# Patient Record
Sex: Female | Born: 1935 | Race: White | Hispanic: No | Marital: Married | Smoking: Never smoker
Health system: Southern US, Community
[De-identification: ages and names within clinical notes are randomized; demographics above are authoritative.]

## PROBLEM LIST (undated history)

## (undated) DIAGNOSIS — J449 Chronic obstructive pulmonary disease, unspecified: Secondary | ICD-10-CM

## (undated) DIAGNOSIS — W19XXXA Unspecified fall, initial encounter: Secondary | ICD-10-CM

## (undated) DIAGNOSIS — R32 Unspecified urinary incontinence: Secondary | ICD-10-CM

## (undated) DIAGNOSIS — G20A1 Parkinson's disease without dyskinesia, without mention of fluctuations: Secondary | ICD-10-CM

## (undated) DIAGNOSIS — K529 Noninfective gastroenteritis and colitis, unspecified: Secondary | ICD-10-CM

## (undated) DIAGNOSIS — R296 Repeated falls: Secondary | ICD-10-CM

## (undated) DIAGNOSIS — K623 Rectal prolapse: Secondary | ICD-10-CM

## (undated) DIAGNOSIS — E039 Hypothyroidism, unspecified: Secondary | ICD-10-CM

## (undated) DIAGNOSIS — R471 Dysarthria and anarthria: Secondary | ICD-10-CM

## (undated) DIAGNOSIS — G20 Parkinson's disease: Secondary | ICD-10-CM

## (undated) DIAGNOSIS — N390 Urinary tract infection, site not specified: Secondary | ICD-10-CM

## (undated) HISTORY — PX: TONSILLECTOMY AND ADENOIDECTOMY: SUR1326

## (undated) HISTORY — DX: Repeated falls: R29.6

## (undated) HISTORY — DX: Rectal prolapse: K62.3

## (undated) HISTORY — DX: Parkinson's disease: G20

## (undated) HISTORY — PX: CATARACT EXTRACTION, BILATERAL: SHX1313

## (undated) HISTORY — DX: Noninfective gastroenteritis and colitis, unspecified: K52.9

## (undated) HISTORY — DX: Chronic obstructive pulmonary disease, unspecified: J44.9

## (undated) HISTORY — DX: Unspecified urinary incontinence: R32

## (undated) HISTORY — DX: Urinary tract infection, site not specified: N39.0

## (undated) HISTORY — DX: Hypothyroidism, unspecified: E03.9

## (undated) HISTORY — PX: FEMUR FRACTURE SURGERY: SHX633

## (undated) HISTORY — PX: REPAIR OF RECTAL PROLAPSE: SHX6420

## (undated) HISTORY — DX: Parkinson's disease without dyskinesia, without mention of fluctuations: G20.A1

## (undated) HISTORY — DX: Unspecified fall, initial encounter: W19.XXXA

## (undated) HISTORY — DX: Dysarthria and anarthria: R47.1

---

## 2013-03-21 HISTORY — PX: CHOLECYSTECTOMY, LAPAROSCOPIC: SHX56

## 2015-12-22 LAB — LIPID PANEL
Cholesterol: 174 mg/dL (ref 0–200)
HDL: 61 mg/dL (ref 35–70)
LDL CALC: 99 mg/dL
TRIGLYCERIDES: 69 mg/dL (ref 40–160)

## 2015-12-22 LAB — VITAMIN D 25 HYDROXY (VIT D DEFICIENCY, FRACTURES): Vit D, 25-Hydroxy: 36

## 2015-12-22 LAB — BASIC METABOLIC PANEL
BUN: 25 mg/dL — AB (ref 4–21)
Creatinine: 0.8 mg/dL (ref 0.5–1.1)
Glucose: 108 mg/dL
Potassium: 4.1 mmol/L (ref 3.4–5.3)
Sodium: 140 mmol/L (ref 137–147)

## 2015-12-22 LAB — HEPATIC FUNCTION PANEL
ALK PHOS: 66 U/L (ref 25–125)
AST: 17 U/L (ref 13–35)
Bilirubin, Total: 0.6 mg/dL

## 2015-12-22 LAB — CBC AND DIFFERENTIAL
HEMATOCRIT: 43 % (ref 36–46)
Hemoglobin: 13.7 g/dL (ref 12.0–16.0)
PLATELETS: 221 10*3/uL (ref 150–399)
WBC: 7.3 10*3/mL

## 2015-12-22 LAB — HEMOGLOBIN A1C: HEMOGLOBIN A1C: 6.2

## 2016-06-23 ENCOUNTER — Encounter: Payer: Self-pay | Admitting: Internal Medicine

## 2016-07-12 ENCOUNTER — Non-Acute Institutional Stay: Payer: Medicare Other | Admitting: Internal Medicine

## 2016-07-12 ENCOUNTER — Encounter: Payer: Self-pay | Admitting: Internal Medicine

## 2016-07-12 VITALS — BP 114/72 | HR 77 | Temp 97.7°F | Ht 64.0 in | Wt 89.0 lb

## 2016-07-12 DIAGNOSIS — E039 Hypothyroidism, unspecified: Secondary | ICD-10-CM

## 2016-07-12 DIAGNOSIS — R471 Dysarthria and anarthria: Secondary | ICD-10-CM

## 2016-07-12 DIAGNOSIS — N812 Incomplete uterovaginal prolapse: Secondary | ICD-10-CM

## 2016-07-12 DIAGNOSIS — J449 Chronic obstructive pulmonary disease, unspecified: Secondary | ICD-10-CM

## 2016-07-12 DIAGNOSIS — G20 Parkinson's disease: Secondary | ICD-10-CM

## 2016-07-12 DIAGNOSIS — W19XXXA Unspecified fall, initial encounter: Secondary | ICD-10-CM | POA: Insufficient documentation

## 2016-07-12 DIAGNOSIS — R2689 Other abnormalities of gait and mobility: Secondary | ICD-10-CM

## 2016-07-12 DIAGNOSIS — G20A1 Parkinson's disease without dyskinesia, without mention of fluctuations: Secondary | ICD-10-CM

## 2016-07-12 DIAGNOSIS — R32 Unspecified urinary incontinence: Secondary | ICD-10-CM | POA: Diagnosis not present

## 2016-07-12 DIAGNOSIS — K8020 Calculus of gallbladder without cholecystitis without obstruction: Secondary | ICD-10-CM | POA: Insufficient documentation

## 2016-07-12 DIAGNOSIS — R296 Repeated falls: Secondary | ICD-10-CM | POA: Insufficient documentation

## 2016-07-12 NOTE — Progress Notes (Signed)
HISTORY AND PHYSICAL  Location:       Place of Service: Clinic (12)   Extended Emergency Contact Information Primary Emergency Contact: Preyer,Karl Address: [address]          [place], [place] [address] Johnnette Litter of [place] Phone: [phone] Mobile Phone: [phone] Relation: Spouse Secondary Emergency Contact: [place] of [place] Mobile Phone: [phone] Relation: Other  Advanced Directive information Does Patient Have a Medical Advance Directive?: Yes, Type of Advance Directive: [place];Living will (out of state, getting updated to [facility])  Chief Complaint  Patient presents with  . Medical Management of Chronic Issues    New Patient from [place], [place]. Here with husband    HPI:  Moved to [facility] with her husband 05/27/16. Adapting to facility. [place] the help of her husband. She is dependent on him for all ADL.  Parkinson disease ([place]) - present since 1995. Has multiple associated issues: mild dysphagia, dysarthria, retropulsion, poor balance, non walker, memory loss, hallucinations of children.  Hypothyroidism, unspecified type - compensated  Chronic obstructive pulmonary disease, unspecified COPD type ([place]) - prior diagnosis. Denies cough or dyspnea at rest on RA.  Urinary incontinence, unspecified type - cystocele. Previously had pessary, but it was removed prior to her move to [facility]. Hx recurrent UTI.  Cystocele with incomplete uterovaginal prolapse  Balance disorder - hx of falls, but none recently.  Dysarthria - makes communication difficult.    Past Medical History:  Diagnosis Date  . COPD (chronic obstructive pulmonary disease) ([place])   . Falls   . Gastroenteritis   . Hip fracture ([place])   . Hypothyroid   . Parkinson disease ([place])    with dementia and hallucinations  . Rectal prolapse   . Recurrent UTI     Past Surgical History:  Procedure Laterality Date    . CHOLECYSTECTOMY, LAPAROSCOPIC  2015   Dr. Dayton Martes   . ORIF HIP FRACTURE    . REPAIR OF RECTAL PROLAPSE    . TONSILLECTOMY AND ADENOIDECTOMY      No care team member to display  Social History   Social History  . Marital status: Married    Spouse name: N/A  . Number of children: N/A  . Years of education: N/A   Occupational History  . Not on file.   Social History Main Topics  . Smoking status: Never Smoker  . Smokeless tobacco: Never Used  . Alcohol use No  . Drug use: No  . Sexual activity: Not on file   Other Topics Concern  . Not on file   Social History Narrative   Moved to [facility] 05/27/2016   No tobacco use ever   . No alcohol use.    Does drinks/ eats things with caffeine.    Married in [place] lives in a 3 story apartment building with 2 other people and no pets.    Current or past profession; day care worker.    Does not exercise.    Has a living will, DNR and POA/HPOA.     reports that she has never smoked. She has never used smokeless tobacco. She reports that she does not drink alcohol or use drugs.  Family History  Problem Relation Age of Onset  . Heart failure Mother 38  . Lymphoma Father 54  . Parkinson's disease Brother   . Brain cancer Brother 98   Family Status  Relation Status  .  Mother Deceased  . Father Deceased  . Brother Alive  . Daughter Alive  . Son Alive  . Brother Deceased  . Brother Deceased  . Brother Alive  . Daughter Alive  . Son Alive    Immunization History  Administered Date(s) Administered  . Influenza-Unspecified 01/20/2016    No Known Allergies  Medications: Patient's Medications  New Prescriptions   No medications on file  Previous Medications   CARBIDOPA-LEVODOPA (SINEMET IR) 25-100 MG TABLET    Take 1 tablet by mouth. Take one tablet four times daily   ENSURE (ENSURE)    Take 237 mLs by mouth. One daily with lunch   LEVOTHYROXINE (SYNTHROID, LEVOTHROID) 88 MCG TABLET    Take 88 mcg by mouth  daily.   SELEGILINE (ELDEPRYL) 5 MG CAPSULE    Take 5 mg by mouth. Take one capsule daily   TOLTERODINE (DETROL LA) 4 MG 24 HR CAPSULE    Take 4 mg by mouth daily.   TRIMETHOPRIM (TRIMPEX) 100 MG TABLET    Take 100 mg by mouth daily.  Modified Medications   No medications on file  Discontinued Medications   No medications on file    Review of Systems  Constitutional: Positive for fatigue. Negative for activity change, appetite change, chills, diaphoresis, fever and unexpected weight change.       Thin. Regained 6 # since move to [place]: Negative for congestion, ear discharge, ear pain, hearing loss, postnasal drip, rhinorrhea, sore throat, tinnitus, trouble swallowing and voice change.        Dry mouth. Has dentures, but generally does not wear them.  Eyes: Negative for pain, redness, itching and visual disturbance.  Respiratory: Negative for cough, choking, shortness of breath and wheezing.        Hx COPD  Cardiovascular: Negative for chest pain, palpitations and leg swelling.       Low BP  Gastrointestinal: Negative for abdominal distention, abdominal pain, constipation, diarrhea and nausea.  Endocrine: Negative for cold intolerance, heat intolerance, polydipsia, polyphagia and polyuria.       Hypothyroid  Genitourinary: Negative for difficulty urinating, dysuria, flank pain, frequency, hematuria, pelvic pain, urgency and vaginal discharge.       Urine incontinence. Cystocele. Previously used pessary.  Musculoskeletal: Positive for gait problem. Negative for arthralgias, back pain, myalgias, neck pain and neck stiffness.  Skin: Negative for color change, pallor and rash.       Hx oiles  Allergic/Immunologic: Negative.   Neurological: Positive for tremors and weakness. Negative for dizziness, seizures, syncope, numbness and headaches.       Parkinsonism, dysarthria, hallucinations, retropulsion, balance disorder.  Hematological: Negative for adenopathy. Does not bruise/bleed  easily.  Psychiatric/Behavioral: Positive for confusion. Negative for agitation, behavioral problems, dysphoric mood, hallucinations, sleep disturbance and suicidal ideas. The patient is not nervous/anxious and is not hyperactive.        Hallucinates children outside and in room. Previously employed as Environmental education officer for 15 years. Loss of memory.    Vitals:   07/12/16 1006  BP: 114/72  Pulse: 77  Temp: 97.7 F (36.5 C)  TempSrc: Oral  SpO2: 91%  Weight: 89 lb (40.4 kg)  Height: 5' 4" (1.626 m)   Body mass index is 15.28 kg/m. Filed Weights   07/12/16 1006  Weight: 89 lb (40.4 kg)     Physical Exam  Constitutional: She is oriented to person, place, and time. She appears well-developed and well-nourished. No distress.  thin  HENT:  Right Ear:  External ear normal.  Left Ear: External ear normal.  Nose: Nose normal.  Mouth/Throat: Oropharynx is clear and moist. No oropharyngeal exudate.  Eyes: Conjunctivae and EOM are normal. Pupils are equal, round, and reactive to light. No scleral icterus.  Neck: No JVD present. No tracheal deviation present. No thyromegaly present.  Cardiovascular: Normal rate, regular rhythm, normal heart sounds and intact distal pulses.  Exam reveals no gallop and no friction rub.   No murmur heard. Pulmonary/Chest: Effort normal. No respiratory distress. She has no wheezes. She has no rales. She exhibits no tenderness.  Abdominal: She exhibits no distension and no mass. There is no tenderness.  Musculoskeletal: Normal range of motion. She exhibits no edema or tenderness.  Poor balance. Rides wheelchair. Husband assists with transfers in and out or bed and wheelchair.  Lymphadenopathy:    She has no cervical adenopathy.  Neurological: She is alert and oriented to person, place, and time. No cranial nerve deficit. Coordination normal.  dysarthria. Tremor bilaterally. Increased rigidity at wrists. Memory deficit. Poor balnce.  Skin: No rash noted. She  is not diaphoretic. No erythema. No pallor.  Psychiatric: She has a normal mood and affect. Her behavior is normal.    Labs reviewed: Lab Summary Latest Ref Rng & Units 12/22/2015  Hemoglobin 12.0 - 16.0 g/dL 13.7  Hematocrit 36 - 46 % 43  White count 10:3/mL 7.3  Platelet count 150 - 399 K/L 221  Sodium 137 - 147 mmol/L 140  Potassium 3.4 - 5.3 mmol/L 4.1  Calcium - (None)  Phosphorus - (None)  Creatinine 0.5 - 1.1 mg/dL 0.8  AST 13 - 35 U/L 17  Alk Phos 25 - 125 U/L 66  Bilirubin - (None)  Glucose mg/dL 108  Cholesterol 0 - 200 mg/dL 174  HDL cholesterol 35 - 70 mg/dL 61  Triglycerides 40 - 160 mg/dL 69  LDL Direct - (None)  LDL Calc mg/dL 99  Total protein - (None)  Albumin - (None)   Lab Results  Component Value Date   BUN 25 (A) 12/22/2015   Lab Results  Component Value Date   HGBA1C 6.2 12/22/2015     Assessment/Plan  1. Parkinson disease ([place]) - The current medical regimen is effective;  continue present plan and medications. - Ambulatory referral to Neurology  2. Hypothyroidism, unspecified type The current medical regimen is effective;  continue present plan and medications.  3. Chronic obstructive pulmonary disease, unspecified COPD type ([place]) No current meds.  4. Urinary incontinence, unspecified type Chronic and unchanged  5. Cystocele with incomplete uterovaginal prolapse Chronic and unchanged  6. Balance disorder Associated with Parkinsonism  7. Dysarthria Associated with Parkinsonism.

## 2016-07-13 ENCOUNTER — Encounter: Payer: Self-pay | Admitting: Neurology

## 2016-07-19 ENCOUNTER — Other Ambulatory Visit: Payer: Self-pay | Admitting: Internal Medicine

## 2016-07-19 ENCOUNTER — Telehealth: Payer: Self-pay | Admitting: Internal Medicine

## 2016-07-19 DIAGNOSIS — N812 Incomplete uterovaginal prolapse: Secondary | ICD-10-CM

## 2016-07-19 DIAGNOSIS — R32 Unspecified urinary incontinence: Secondary | ICD-10-CM

## 2016-07-19 NOTE — Telephone Encounter (Signed)
Done

## 2016-07-19 NOTE — Telephone Encounter (Signed)
I called patients husband this afternoon to give him the appointment information for his referral to Urology - Patient inquired about wife's referral to Urology, I do not have a referral for Urology for patient.  Please advise.

## 2016-07-19 NOTE — Telephone Encounter (Signed)
Patient has been seen once. She has incontinence that was previously treated with a pessary, but this was removed prior to her move to [place]. If her husband wants to have her seen by a urologist, I will enter the order. She is bed and wheelchair bound. I was not aware he wanted a referral for her.

## 2016-07-19 NOTE — Telephone Encounter (Signed)
Called the husband back to verify that he wishes a referral as patient is bed & wheelchair bound. He stated that she is on an antibiotic for UTI's due to her incontinence and he feels this should be evaluated by a urologist as she has been taking the antibiotic for years.   Please enter a Urology referral per husbands request.

## 2016-07-29 DIAGNOSIS — M79671 Pain in right foot: Secondary | ICD-10-CM | POA: Diagnosis not present

## 2016-07-29 DIAGNOSIS — M79672 Pain in left foot: Secondary | ICD-10-CM | POA: Diagnosis not present

## 2016-07-29 DIAGNOSIS — B351 Tinea unguium: Secondary | ICD-10-CM | POA: Diagnosis not present

## 2016-07-29 DIAGNOSIS — L84 Corns and callosities: Secondary | ICD-10-CM | POA: Diagnosis not present

## 2016-08-02 NOTE — Progress Notes (Signed)
Lisa Henry was seen today in the movement disorders clinic for neurologic consultation at the request of [name], MD.  The consultation is for the evaluation of PD.  Dx was apparently made on Oct 1,1995.  First sx was R leg tremor.  Husband states that carbidopa/levodopa and requip were started at same time and she did well with these meds.  Husband unsure of why requip was taken off (presumably memory change but he states it was years ago).  She is on carbidopa/levodopa 25/100, qid (mealtime and bedtime).  She is also on selegeline, 5 mg, 2 po q am.    No prior neuro records are available.     Specific Symptoms:  Tremor: Yes.  , if upset or anxious and at 4am (now both arms and both legs).  Will sometimes awaken her but many times she is awake and then the tremor starts Family hx of similar:  Yes.  , brother Voice: weak and loss of spontaneity of speech Sleep: sleeps well until 3-4 am  Vivid Dreams:  some  Acting out dreams:  Sleep talks some Wet Pillows: No. Postural symptoms:  Yes.  , cannot use a walker because leans backward.  Uses husband for support  Falls?  No. Bradykinesia symptoms: husband has to assist with OOC Loss of smell:  Pt unable to answer Urinary Incontinence:  Yes.  , wears depends x years Difficulty Swallowing:  No. Trouble with ADL's:  Yes.   (relies on husband for assist with all ADLs)  Trouble buttoning clothing: Yes.   Memory changes:  Yes.   (3 years) Hallucinations:  Yes.  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   N/V:  No. Lightheaded: no (was when she was on different bladder drug but better now)  Syncope: No. Diplopia:  No. Dyskinesia:  No.  PREVIOUS MEDICATIONS: Sinemet, Requip, Eldpryl and nuplazid  ALLERGIES:  No Known Allergies  CURRENT MEDICATIONS:  Outpatient Encounter Prescriptions as of 08/04/2016  Medication Sig  . carbidopa-levodopa (SINEMET  IR) 25-100 MG tablet Take 1 tablet by mouth. Take one tablet four times daily  . ENSURE (ENSURE) Take 237 mLs by mouth. One daily with lunch  . levothyroxine (SYNTHROID, LEVOTHROID) 88 MCG tablet Take 88 mcg by mouth daily.  . selegiline (ELDEPRYL) 5 MG capsule Take 5 mg by mouth. Take one capsule daily  . tolterodine (DETROL LA) 4 MG 24 hr capsule Take 4 mg by mouth daily.  Marland Kitchen trimethoprim (TRIMPEX) 100 MG tablet Take 100 mg by mouth daily.   No facility-administered encounter medications on file as of 08/04/2016.     PAST MEDICAL HISTORY:   Past Medical History:  Diagnosis Date  . COPD (chronic obstructive pulmonary disease) ([facility])   . Dysarthria   . Falls   . Gastroenteritis   . Hypothyroid   . Incontinence   . 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 Rectal prolapse   . Recurrent UTI     PAST SURGICAL HISTORY:   Past Surgical History:  Procedure Laterality Date  . CATARACT EXTRACTION, BILATERAL    . CHOLECYSTECTOMY, LAPAROSCOPIC  2015   Dr. Arville Care   . FEMUR FRACTURE SURGERY    . REPAIR OF RECTAL PROLAPSE    . TONSILLECTOMY AND ADENOIDECTOMY      SOCIAL HISTORY:   Social History   Social History  . Marital status: Married    Spouse  name: N/A  . Number of children: N/A  . Years of education: N/A   Occupational History  . Not on file.   Social History Main Topics  . Smoking status: Never Smoker  . Smokeless tobacco: Never Used  . Alcohol use No  . Drug use: No  . Sexual activity: Not on file   Other Topics Concern  . Not on file   Social History Narrative   [place] [place] [facility]   No tobacco use ever   . No alcohol use.    Does drinks/ eats things with caffeine.    Mar[place] in [place] lives in a 3 story apartment building with 2 other people and no pets.    Current or past profession; day care worker.    Does not exercise.    Has a living will, DNR and POA/HPOA.     FAMILY HISTORY:   Family Status  Relation Status    . Mother Deceased  . Father Deceased  . Brother Alive  . Daughter Alive  . Son Alive  . Brother Deceased  . Brother Deceased  . Brother Alive  . Daughter Alive  . Son Alive    ROS:  A complete 10 system review of systems was obtained and was unremarkable apart from what is mentioned above.  PHYSICAL EXAMINATION:    VITALS:   Vitals:   08/04/16 0934  BP: 140/70  Pulse: 84    GEN:  The patient appears stated age and is in NAD. HEENT:  Normocephalic, atraumatic.  The mucous membranes are very dry. The superficial temporal arteries are without ropiness or tenderness. CV:  RRR Lungs:  CTAB Neck/HEME:  There are no carotid bruits bilaterally.  Neurological examination:  Orientation: The patient is alert but unable to name month, date, year.  Attempted moca for 20 min but patient just stared at examiner and it had to be discontinued. Cranial nerves: There is good facial symmetry. Pupils are equal round and reactive to light bilaterally. Fundoscopic exam is attempted but the disc margins are not well visualized bilaterally. Extraocular muscles are intact. The visual fields are full to confrontational testing. The speech is very hypophonic but lacks spontaneity.  Soft palate rises symmetrically and there is no tongue deviation. Hearing is intact to conversational tone. Sensation: Sensation is intact to light touch.  Does not participate with sensitive aspects of the sensory testing.  Reports vibration is intact at the bilateral big toe.  Motor: Strength is at least antigravity 4.  Does not participate with manual muscle testing. Deep tendon reflexes: Deep tendon reflexes are 2-2+/4 at the bilateral biceps, triceps, brachioradialis, patella and trace at the bilateral achilles. Plantar responses are downgoing bilaterally.  Movement examination: Tone: There is mod increased tone in the LUE and mild to mod in the RUE Abnormal movements: There is rare R foot tremor and L foot tremor that  are independent of one another.  Rare LUE resting tremor  Coordination:  There is decremation with RAM's, with all RAMs but there is also significant apraxia especially with RAMs on the L hemibody Gait and Station: The patient is in a wheelchair and unable to use a walker per her husband.  She only walks with his assistance  ASSESSMENT/PLAN:  1.  Idiopathic Parkinson's disease, advance.  The patient has tremor, bradykinesia, rigidity and mild postural instability.  -We discussed the diagnosis as well as pathophysiology of the disease.  We discussed treatment options as well as prognostic indicators.  Patient  education was provided.  -We discussed that it used to be thought that levodopa would increase risk of melanoma but now it is believed that Parkinsons itself likely increases risk of melanoma. she is to get regular skin checks.  -Greater than 50% of the 60 minute visit was spent in counseling answering questions and talking about what to expect now as well as in the future.  We talked about medication options as well as potential future surgical options.  We talked about safety in the home.  -Continue carbidopa/levodopa 25/100, one tablet 3 times per day, but move 30 minutes prior to the mealtime  -Discontinue nighttime carbidopa/levodopa 25/100 and replace with carbidopa/levodopa 50/200 CR.  I am hoping that this will help her with her 3 AM tremor which is keeping her up  -Told her husband that I will likely discontinue her selegiline in the near future.  This can contribute to cognitive change/hallucinations.  I don't think that this is helping her much either.   2.  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  This is a common part of Parkinson's disease and we discussed this today.  We talked about the nature, etiology and pathophysiology as well as the degenerative nature.    We discussed the importance of  mental and physical exercise in the treatment of dementia, and discussed in detail what these things mean.  We discussed community resources and addressed safety in the home.  We discussed caregiver support and caregiver resources.  We offered our PD social worker as part of a support system.  Pt has been on nuplazid in the past and they thought that it worked for a while, but ended up stopping the medication in November, 2017.  They did not notice a deterioration in symptoms when she stopped it, but also noticed that nothing got better.  We may try quetiapine in the future.  She is having significant agitation in the evening.  3.  I will plan on seeing them back in about 3 months, sooner should new neurologic issues arise.    Cc:  Kimber Relic, MD

## 2016-08-04 ENCOUNTER — Encounter: Payer: Self-pay | Admitting: Neurology

## 2016-08-04 ENCOUNTER — Telehealth: Payer: Self-pay | Admitting: Neurology

## 2016-08-04 ENCOUNTER — Ambulatory Visit (INDEPENDENT_AMBULATORY_CARE_PROVIDER_SITE_OTHER): Payer: Medicare Other | Admitting: Neurology

## 2016-08-04 VITALS — BP 140/70 | HR 84

## 2016-08-04 DIAGNOSIS — F0281 Dementia in other diseases classified elsewhere with behavioral disturbance: Secondary | ICD-10-CM

## 2016-08-04 DIAGNOSIS — G20 Parkinson's disease: Secondary | ICD-10-CM | POA: Diagnosis not present

## 2016-08-04 DIAGNOSIS — R441 Visual hallucinations: Secondary | ICD-10-CM

## 2016-08-04 MED ORDER — CARBIDOPA-LEVODOPA ER 50-200 MG PO TBCR: 1.0000 | EXTENDED_RELEASE_TABLET | Freq: Every day | ORAL | 1 refills | Status: DC

## 2016-08-04 NOTE — Patient Instructions (Signed)
1.  Take carbidopa/levodopa 25/100 three times per day, 30 min prior to a protein source 2.  Discontinue night time carbidopa/levodopa 25/100 3.  Add carbidopa/levodopa 50/200 at nighttime (bedtime) 4.  For now, continue selegeline but I likely will stop that in the future 5.  Good to meet you!  Make your follow up in about 3 months

## 2016-08-04 NOTE — Telephone Encounter (Signed)
-  [facility] [place] - Dr. Lindalou Hoseotugno ph- [phone] fax- [phone].

## 2016-08-05 ENCOUNTER — Other Ambulatory Visit: Payer: Self-pay | Admitting: *Deleted

## 2016-08-05 MED ORDER — LEVOTHYROXINE SODIUM 88 MCG PO TABS: 88.0000 ug | ORAL_TABLET | Freq: Every day | ORAL | 1 refills | Status: DC

## 2016-08-05 NOTE — Telephone Encounter (Signed)
[facility] 

## 2016-08-12 ENCOUNTER — Telehealth: Payer: Self-pay | Admitting: *Deleted

## 2016-08-12 NOTE — Telephone Encounter (Signed)
Karl,husband called and stated that patient's insurance company stated they would only pay for a 30 day supply of patient's thyroid medication due to Brand. He stated that she has been taking the generic Levothyroxine.  Explained to him that we faxed in a Rx for the Generic Levothyroxine. Told him I would call pharmacy and see what was going on.  I called the pharmacy and spoke with Tresa EndoKelly and she corrected the medication in her system and will send out a 90 day supply to patient.

## 2016-09-06 DIAGNOSIS — H524 Presbyopia: Secondary | ICD-10-CM | POA: Diagnosis not present

## 2016-09-06 DIAGNOSIS — H35363 Drusen (degenerative) of macula, bilateral: Secondary | ICD-10-CM | POA: Diagnosis not present

## 2016-09-07 ENCOUNTER — Telehealth: Payer: Self-pay | Admitting: Neurology

## 2016-09-07 MED ORDER — SELEGILINE HCL 5 MG PO CAPS: 5.0000 mg | ORAL_CAPSULE | Freq: Two times a day (BID) | ORAL | 0 refills | Status: DC

## 2016-09-07 NOTE — Telephone Encounter (Signed)
RX sent to pharmacy  

## 2016-09-07 NOTE — Telephone Encounter (Signed)
PT's husband called and said she needs a prescription for Selegiline called in to the mail order [facility] for a 90 day supply

## 2016-09-09 MED ORDER — SELEGILINE HCL 5 MG PO CAPS: 5.0000 mg | ORAL_CAPSULE | Freq: Two times a day (BID) | ORAL | 0 refills | Status: DC

## 2016-09-09 NOTE — Addendum Note (Signed)
Addended bySilvio Pate: MCCRACKEN, JADE L on: 09/09/2016 07:59 AM   Modules accepted: Orders

## 2016-09-16 DIAGNOSIS — N3946 Mixed incontinence: Secondary | ICD-10-CM | POA: Diagnosis not present

## 2016-09-16 DIAGNOSIS — N390 Urinary tract infection, site not specified: Secondary | ICD-10-CM | POA: Diagnosis not present

## 2016-10-06 ENCOUNTER — Telehealth: Payer: Self-pay | Admitting: Neurology

## 2016-10-06 NOTE — Telephone Encounter (Signed)
Caller: Lisa Henry's husband  Urgent? No  Reason for the call: She is needing [facility] [address] Please call husband. Thanks

## 2016-10-06 NOTE — Telephone Encounter (Signed)
RX sent. Patient's husband made aware.

## 2016-10-10 ENCOUNTER — Telehealth: Payer: Self-pay | Admitting: Neurology

## 2016-10-10 MED ORDER — SELEGILINE HCL 5 MG PO CAPS: 5.0000 mg | ORAL_CAPSULE | Freq: Two times a day (BID) | ORAL | 1 refills | Status: DC

## 2016-10-10 NOTE — Telephone Encounter (Signed)
Patient's husband made aware [facility] was [facility]  [facility]

## 2016-10-10 NOTE — Telephone Encounter (Signed)
Patient's husband called to say that [facility] not [facility] the prescription there on Saturday when he went to pick it up. He [facility] is the [facility] on [address] Please Advise. Thanks

## 2016-10-13 ENCOUNTER — Telehealth: Payer: Self-pay | Admitting: Neurology

## 2016-10-13 MED ORDER — SELEGILINE HCL 5 MG PO TABS: 5.0000 mg | ORAL_TABLET | Freq: Two times a day (BID) | ORAL | 1 refills | Status: DC

## 2016-10-13 NOTE — Telephone Encounter (Signed)
Caller: [employer]  Urgent? No  Reason for the call: They called to let you know that the medication Selegiline 5 MG was denied because they would like for her to try an alternative. She said they will be sending out a letter to the office. Thanks

## 2016-10-13 NOTE — Telephone Encounter (Signed)
Received request for prior auth on Selegiline.  Spoke with husband and made him aware I submitted this to insurance.  He states it may be the difference between capsules and tablets - he has had this issue before.  Made him aware if I don't hear from insurance by the end of the day I will send in tablets. He agrees with this plan.

## 2016-10-13 NOTE — Telephone Encounter (Signed)
Caller: Patient's husband  Urgent? Yes  Reason for the call: letter was faxed for Dr. Don Perkingat's signature for Medicare purposes? Patient 's husband said she has 6 pills left. Please see Previous phone note. Thanks

## 2016-10-13 NOTE — Telephone Encounter (Signed)
Selegiline tablets sent instead of capsules.  [facility] states this is covered and will available tomorrow.  Husband made aware.

## 2016-10-14 ENCOUNTER — Telehealth: Payer: Self-pay | Admitting: Neurology

## 2016-10-14 NOTE — Telephone Encounter (Signed)
Caller: [employer]  Urgent? No  Reason for the call: They called to let you know that the medication Selegiline has been denied. They said the tablets were probably covered but not the capsules. Thanks

## 2016-10-14 NOTE — Telephone Encounter (Signed)
Noted. Already sent tablets yesterday after they called with the same message.

## 2016-10-17 ENCOUNTER — Telehealth: Payer: Self-pay | Admitting: Neurology

## 2016-10-17 NOTE — Telephone Encounter (Signed)
PT's spouse called and said the medication they picked up was for $RemoveBefor [id number]$5mg

## 2016-10-17 NOTE — Telephone Encounter (Signed)
Selegiline was written for twice a day. Patient has only ever taken once daily. I advised him to continue once daily - written twice daily as this is the normal RX. Patient's husband expressed understanding. I apologized for the mix up.

## 2016-11-07 ENCOUNTER — Telehealth: Payer: Self-pay | Admitting: Neurology

## 2016-11-07 MED ORDER — CARBIDOPA-LEVODOPA 25-100 MG PO TABS: 1.0000 | ORAL_TABLET | Freq: Three times a day (TID) | ORAL | 0 refills | Status: DC

## 2016-11-07 NOTE — Telephone Encounter (Signed)
RX sent

## 2016-11-07 NOTE — Telephone Encounter (Signed)
Needs new rx [facility] for Carbidopa-Levedopa 25-100 mg for 3 a day.

## 2016-11-16 NOTE — Progress Notes (Signed)
Lisa Henry was seen today in the movement disorders clinic for neurologic consultation at the request of No primary care provider on file..  The consultation is for the evaluation of PD.  Dx was apparently made on Oct 1,1995.  First sx was R leg tremor.  Husband states that carbidopa/levodopa and requip were started at same time and she did well with these meds.  Husband unsure of why requip was taken off (presumably memory change but he states it was years ago).  She is on carbidopa/levodopa 25/100, qid (mealtime and bedtime).  She is also on selegeline, 5 mg, 2 po q am.    No prior neuro records are available.    11/17/16 update:  Patient seen today in follow-up for her Parkinson's disease.  She is accompanied by her husband and daughter who supplements the history.  The patient is on carbidopa/levodopa 25/100, one tablet 3 times per day.  I changed her nighttime levodopa to carbidopa/levodopa 50/200.  I did this in hopes that it would help her 3 AM tremor.  Her husband states that this is still there.  She is still on selegiline 5 mg and takes 2 tablets in the morning.  In regards to hallucinations, she still has these.  She doesn't know that they aren't real.  More fearful of these.   She has illusions as well.  She has had no lightheadedness or near syncope.  No falls since our last visit.  No new medical issues have arisen.  She has some nightmares at night.    PREVIOUS MEDICATIONS: Sinemet, Requip, Eldpryl and nuplazid  ALLERGIES:  No Known Allergies  CURRENT MEDICATIONS:  Outpatient Encounter Prescriptions as of 11/17/2016  Medication Sig  . carbidopa-levodopa (SINEMET CR) 50-200 MG tablet Take 1 tablet by mouth at bedtime.  . carbidopa-levodopa (SINEMET IR) 25-100 MG tablet Take 1 tablet by mouth 3 (three) times daily.  Marland Kitchen. ENSURE (ENSURE) Take 237 mLs by mouth. One daily with lunch  . levothyroxine (SYNTHROID, LEVOTHROID) 88 MCG tablet Take 1 tablet (88 mcg total) by mouth daily.  .  selegiline (ELDEPRYL) 5 MG tablet Take 1 tablet (5 mg total) by mouth 2 (two) times daily with a meal.  . tolterodine (DETROL LA) 4 MG 24 hr capsule Take 4 mg by mouth daily.  Marland Kitchen. trimethoprim (TRIMPEX) 100 MG tablet Take 100 mg by mouth daily.   No facility-administered encounter medications on file as of 11/17/2016.     PAST MEDICAL HISTORY:   Past Medical History:  Diagnosis Date  . COPD (chronic obstructive pulmonary disease) (HCC)   . Dysarthria   . Falls   . Gastroenteritis   . Hypothyroid   . Incontinence   . 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 Rectal prolapse   . Recurrent UTI     PAST SURGICAL HISTORY:   Past Surgical History:  Procedure Laterality Date  . CATARACT EXTRACTION, BILATERAL    . CHOLECYSTECTOMY, LAPAROSCOPIC  2015   Dr. Arville Careepeppi   . FEMUR FRACTURE SURGERY    . REPAIR OF RECTAL PROLAPSE    . TONSILLECTOMY AND ADENOIDECTOMY      SOCIAL HISTORY:   Social History   Social History  . Marital status: Married    Spouse name: N/A  . Number of children: N/A  . Years of education: N/A   Occupational History  . Not on file.   Social History Main Topics  . Smoking status: Never Smoker  . Smokeless  tobacco: Never Used  . Alcohol use No  . Drug use: No  . Sexual activity: Not on file   Other Topics Concern  . Not on file   Social History Narrative   Moved to [facility] 05/27/2016   No tobacco use ever   . No alcohol use.    Does drinks/ eats things with caffeine.    Married in [place] lives in a 3 story apartment building with 2 other people and no pets.    Current or past profession; day care worker.    Does not exercise.    Has a living will, DNR and POA/HPOA.     FAMILY HISTORY:   Family Status  Relation Status  . Mother Deceased  . Father Deceased  . Brother Alive  . Daughter Alive  . Son Alive  . Brother Deceased  . Brother Deceased  . Brother Alive  . Daughter Alive  . Son Alive    ROS:  A complete  10 system review of systems was obtained and was unremarkable apart from what is mentioned above.  PHYSICAL EXAMINATION:    VITALS:   There were no vitals filed for this visit.  GEN:  The patient appears stated age and is in NAD. HEENT:  Normocephalic, atraumatic.  The mucous membranes are very dry. The superficial temporal arteries are without ropiness or tenderness. CV:  RRR Lungs:  CTAB Neck/HEME:  There are no carotid bruits bilaterally.  Neurological examination:  Orientation: The patient is alert but unable to name month, date, year.  Attempted moca for 20 min but patient just stared at examiner and it had to be discontinued. Cranial nerves: There is good facial symmetry. Pupils are equal round and reactive to light bilaterally. Fundoscopic exam is attempted but the disc margins are not well visualized bilaterally. Extraocular muscles are intact. The visual fields are full to confrontational testing. The speech is very hypophonic but lacks spontaneity.  Soft palate rises symmetrically and there is no tongue deviation. Hearing is intact to conversational tone. Sensation: Sensation is intact to light touch.  Does not participate with sensitive aspects of the sensory testing.  Reports vibration is intact at the bilateral big toe.  Motor: Strength is at least antigravity 4.  Does not participate with manual muscle testing.   Movement examination: Tone: There is mod increased tone in the LUE and mild to mod in the RUE.  There is some apraxia Abnormal movements: There is mild L arm tremor Coordination:  There is decremation with RAM's, with all RAMs but there is also significant apraxia especially with RAMs on the L hemibody Gait and Station: The patient is in a wheelchair and unable to use a walker per her husband.  She only walks with his assistance  ASSESSMENT/PLAN:  1.  Idiopathic Parkinson's disease, advance.  The patient has tremor, bradykinesia, rigidity and mild postural  instability.  -We discussed the diagnosis as well as pathophysiology of the disease.  We discussed treatment options as well as prognostic indicators.  Patient education was provided.  -We discussed that it used to be thought that levodopa would increase risk of melanoma but now it is believed that Parkinsons itself likely increases risk of melanoma. she is to get regular skin checks.  -Continue carbidopa/levodopa 25/100, one tablet 3 times per day, but move 30 minutes prior to the mealtime  -continue carbidopa/levodopa 50/200 q hs  -discontinue selegeline.  Likely not helping much and may be contributing to confusion.  Will call  them in a week and see how they are doing   2.  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  This is a common part of Parkinson's disease and we discussed this today.  We talked about the nature, etiology and pathophysiology as well as the degenerative nature.    We discussed the importance of mental and physical exercise in the treatment of dementia, and discussed in detail what these things mean.  We discussed community resources and addressed safety in the home.  We discussed caregiver support and caregiver resources.  We offered our PD social worker as part of a support system.  Pt has been on nuplazid in the past and they thought that it worked for a while, but ended up stopping the medication in November, 2017.  They did not notice a deterioration in symptoms when she stopped it.  Hallucinations are becoming worse.  She is becoming more combattive and fearful.  If no better with stopping selegeline, family would like to start seroquel.  We did talk about the fact that the atypical antipsychotic medications are not indicated for dementia related psychosis and increased risk of mortality in the elderly, usually because of infectious or  cardiac related. Understanding is expressed and they were  agreeable that the benefits outweigh the risks in this case.  3.  Follow up is anticipated in the next few months, sooner should new neurologic issues arise.  Much greater than 50% of this visit was spent in counseling and coordinating care.  Total face to face time:  35 min (daughter never here previously and lots of questions)     Cc:  No primary care provider on file.

## 2016-11-17 ENCOUNTER — Ambulatory Visit (INDEPENDENT_AMBULATORY_CARE_PROVIDER_SITE_OTHER): Payer: Medicare Other | Admitting: Neurology

## 2016-11-17 ENCOUNTER — Encounter: Payer: Self-pay | Admitting: Neurology

## 2016-11-17 VITALS — BP 108/64 | HR 100 | Ht 64.0 in | Wt 86.6 lb

## 2016-11-17 DIAGNOSIS — F0281 Dementia in other diseases classified elsewhere with behavioral disturbance: Secondary | ICD-10-CM

## 2016-11-17 DIAGNOSIS — G20 Parkinson's disease: Secondary | ICD-10-CM

## 2016-11-17 DIAGNOSIS — F02818 Dementia in other diseases classified elsewhere, unspecified severity, with other behavioral disturbance: Secondary | ICD-10-CM

## 2016-11-17 NOTE — Patient Instructions (Signed)
1.  Stop selegeline 2.  We will call you in a week.  If confusion, hallucination and combattiveness are the same, we will add in the medication we discussed 3.  Make a follow up in 3 months

## 2016-11-25 ENCOUNTER — Telehealth: Payer: Self-pay | Admitting: Neurology

## 2016-11-25 MED ORDER — QUETIAPINE FUMARATE 25 MG PO TABS: 25.0000 mg | ORAL_TABLET | Freq: Every day | ORAL | 1 refills | Status: DC

## 2016-11-25 NOTE — Telephone Encounter (Signed)
Patient's husband made aware. RX sent to pharmacy.  

## 2016-11-25 NOTE — Telephone Encounter (Signed)
Calling patient to see how she is doing after stopping Selegiline. To see If confusion, hallucination and combattiveness are the same.  Spoke with husband and he states no difference at all in symptoms. He is agreeable to start medication that was discussed.   Dr. Arbutus Leasat please advise on medication and dosage?

## 2016-11-25 NOTE — Telephone Encounter (Signed)
Start seroquel - 25 mg - 1/2 tablet at night for a week and if not enough, go to 1 tablet at night.  Call him in 2 weeks.  Likely will need daytime dosages for agitation/combatitiness/hallucinations

## 2016-12-12 ENCOUNTER — Telehealth: Payer: Self-pay | Admitting: Neurology

## 2016-12-12 NOTE — Telephone Encounter (Signed)
Called patient's husband to see how she is doing on Seroquel. He states she only started it 4 days ago. She is still on a half tablet. He states the first two doses she woke up more combative and has been more tired. He will keep an eye on this and call after she has been on one tablet at night for two weeks.   He does state her hallucinations and restlessness did get better after selegiline was stopped. He states he didn't notice any difference when I talked to him on 11/25/2016, but that it was better.   We will talk in a few weeks.

## 2016-12-28 DIAGNOSIS — Z23 Encounter for immunization: Secondary | ICD-10-CM | POA: Diagnosis not present

## 2016-12-30 DIAGNOSIS — B351 Tinea unguium: Secondary | ICD-10-CM | POA: Diagnosis not present

## 2016-12-30 DIAGNOSIS — L84 Corns and callosities: Secondary | ICD-10-CM | POA: Diagnosis not present

## 2016-12-30 DIAGNOSIS — M79672 Pain in left foot: Secondary | ICD-10-CM | POA: Diagnosis not present

## 2016-12-30 DIAGNOSIS — M79671 Pain in right foot: Secondary | ICD-10-CM | POA: Diagnosis not present

## 2017-01-01 ENCOUNTER — Other Ambulatory Visit: Payer: Self-pay | Admitting: Neurology

## 2017-01-02 ENCOUNTER — Telehealth: Payer: Self-pay | Admitting: Neurology

## 2017-01-02 MED ORDER — QUETIAPINE FUMARATE 25 MG PO TABS: 25.0000 mg | ORAL_TABLET | Freq: Every day | ORAL | 1 refills | Status: DC

## 2017-01-02 NOTE — Telephone Encounter (Signed)
Left message on machine for patient to call back.

## 2017-01-02 NOTE — Telephone Encounter (Signed)
Patient requested refill of Seroquel. Will call and speak with them prior to filling medication. He was to call in two weeks to let us know how she is doing on medication.

## 2017-01-02 NOTE — Telephone Encounter (Signed)
Spoke with patient's husband. He states she is doing well on Seroquel one tablet at night. She is not combative any longer. Not really helping with hallucinations, but they prefer to stay on one tablet at night. Will sent refill.   Dr. Carmel Sacramento.

## 2017-02-14 ENCOUNTER — Other Ambulatory Visit: Payer: Self-pay | Admitting: Neurology

## 2017-02-14 MED ORDER — CARBIDOPA-LEVODOPA 25-100 MG PO TABS: 1.0000 | ORAL_TABLET | Freq: Three times a day (TID) | ORAL | 0 refills | Status: DC

## 2017-02-14 MED ORDER — CARBIDOPA-LEVODOPA ER 50-200 MG PO TBCR: 1.0000 | EXTENDED_RELEASE_TABLET | Freq: Every day | ORAL | 0 refills | Status: DC

## 2017-02-20 NOTE — Progress Notes (Signed)
Lisa Henry was seen today in the movement disorders clinic for neurologic consultation at the request of [name], Mahima, MD.  The consultation is for the evaluation of PD.  Dx was apparently made on Oct 1,1995.  First sx was R leg tremor.  Husband states that carbidopa/levodopa and requip were started at same time and she did well with these meds.  Husband unsure of why requip was taken off (presumably memory change but he states it was years ago).  She is on carbidopa/levodopa 25/100, qid (mealtime and bedtime).  She is also on selegeline, 5 mg, 2 po q am.    No prior neuro records are available.    11/17/16 update:  Patient seen today in follow-up for her Parkinson's disease.  She is accompanied by her husband and daughter who supplements the history.  The patient is on carbidopa/levodopa 25/100, one tablet 3 times per day.  I changed her nighttime levodopa to carbidopa/levodopa 50/200.  I did this in hopes that it would help her 3 AM tremor.  Her husband states that this is still there.  She is still on selegiline 5 mg and takes 2 tablets in the morning.  In regards to hallucinations, she still has these.  She doesn't know that they aren't real.  More fearful of these.   She has illusions as well.  She has had no lightheadedness or near syncope.  No falls since our last visit.  No new medical issues have arisen.  She has some nightmares at night.    02/21/17 update: Patient seen today in follow-up for her Parkinson's disease with Parkinson's dementia.  She is accompanied by her husband and daughter who supplement the history.  She is on carbidopa/levodopa 25/100, 1 tablet 3 times per day and carbidopa/levodopa 50/200 at night.  We discontinued her selegiline last visit.  We added quetiapine.  She is on 25 mg at night.  She is less agitated, but still has hallucinations.  Her family did not want to add daytime quetiapine.  Sleeping is better.  Hallucinations are there most of the time but she isn't  agitated by them and haven't been disturbing to patient or family.  Some illusions as well.  Husband has very little respite care.  On Thursdays, he has someone for 2 hours/day 1 week and 3 hours the next day.  His daughter is concerned that that is not enough.  They also are not well equipped within the home, although they do live within senior living.  They do live independently.  PREVIOUS MEDICATIONS: Sinemet, Requip, Eldpryl and nuplazid  ALLERGIES:  No Known Allergies  CURRENT MEDICATIONS:  Outpatient Encounter Medications as of 02/21/2017  Medication Sig  . carbidopa-levodopa (SINEMET CR) 50-200 MG tablet Take 1 tablet by mouth at bedtime.  . carbidopa-levodopa (SINEMET IR) 25-100 MG tablet Take 1 tablet by mouth 3 (three) times daily.  Marland Henry. ENSURE (ENSURE) Take 237 mLs by mouth. One daily with lunch  . levothyroxine (SYNTHROID, LEVOTHROID) 88 MCG tablet Take 1 tablet (88 mcg total) by mouth daily.  . QUEtiapine (SEROQUEL) 25 MG tablet Take 1 tablet (25 mg total) by mouth at bedtime.  . tolterodine (DETROL LA) 4 MG 24 hr capsule Take 4 mg by mouth daily.  Marland Henry. trimethoprim (TRIMPEX) 100 MG tablet Take 100 mg by mouth daily.  . AMBULATORY NON FORMULARY MEDICATION Lift Chair Dx: G20  . [DISCONTINUED] selegiline (ELDEPRYL) 5 MG tablet Take 1 tablet (5 mg total) by mouth 2 (two) times daily  with a meal. (Patient taking differently: Take 5 mg by mouth daily before breakfast. )   No facility-administered encounter medications on file as of 02/21/2017.     PAST MEDICAL HISTORY:   Past Medical History:  Diagnosis Date  . COPD (chronic obstructive pulmonary disease) (HCC)   . Dysarthria   . Falls   . Gastroenteritis   . Hypothyroid   . Incontinence   . 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 Rectal prolapse   . Recurrent UTI     PAST SURGICAL HISTORY:   Past Surgical History:  Procedure Laterality Date  . CATARACT EXTRACTION, BILATERAL    . CHOLECYSTECTOMY,  LAPAROSCOPIC  2015   Dr. Arville Careepeppi   . FEMUR FRACTURE SURGERY    . REPAIR OF RECTAL PROLAPSE    . TONSILLECTOMY AND ADENOIDECTOMY      SOCIAL HISTORY:   Social History   Socioeconomic History  . Marital status: Married    Spouse name: Not on file  . Number of children: Not on file  . Years of education: Not on file  . Highest education level: Not on file  Social Needs  . Financial resource strain: Not on file  . Food insecurity - worry: Not on file  . Food insecurity - inability: Not on file  . Transportation needs - medical: Not on file  . Transportation needs - non-medical: Not on file  Occupational History  . Not on file  Tobacco Use  . Smoking status: Never Smoker  . Smokeless tobacco: Never Used  Substance and Sexual Activity  . Alcohol use: No  . Drug use: No  . Sexual activity: Not on file  Other Topics Concern  . Not on file  Social History Narrative   [place] [facility] [place]   No tobacco use ever   . No alcohol use.    Does drinks/ eats things with caffeine.    Married in [place]1957 lives in a 3 story apartment building with 2 other people and no pets.    Current or past profession; day care worker.    Does not exercise.    Has a living will, DNR and POA/HPOA.     FAMILY HISTORY:   Family Status  Relation Name Status  . Mother Lisa Henry R Henry Henry  . Father Lisa Henry Henry  . Brother Lisa Henry Alive  . Daughter Lisa Henry Alive  . Son Lisa Henry Alive  . Brother Lisa Henry  . Brother Lisa Henry  . Brother Lisa Henry Alive  . Daughter Lisa Henry Alive  . Son Lisa Henry Alive    ROS:  A complete 10 system review of systems was obtained and the patient is really unable to participate with this due to significant dementia.  PHYSICAL EXAMINATION:    VITALS:   Vitals:   02/21/17 1459  BP: 114/66  Pulse: 94  SpO2: 91%    GEN:  The patient appears stated age and is in NAD. HEENT:  Normocephalic, atraumatic.  The mucous membranes are  very dry. The superficial temporal arteries are without ropiness or tenderness. CV:  RRR Lungs:  CTAB Neck/HEME:  There are no carotid bruits bilaterally.  Neurological examination:  Orientation: The patient is alert but is unable to participate with questions of orientation. Cranial nerves: There is good facial symmetry.  She tracks appropriately about the room, but does not participate with extraocular muscle testing.  Her speech is somewhat dysarthric.  Hearing appears to be intact  to conversational tone.   Sensation: Sensation is intact to light touch.  Does not participate with sensitive aspects of the sensory testing.  (Same as prior) Motor: Strength is at least antigravity 4.  Does not participate with manual muscle testing.  (Same as prior)   Movement examination: Tone: There is mild increased tone in both upper extremities.  There is some apraxia Abnormal movements: There is L arm and bilateral LE tremor.   Coordination:  There is difficulty assessing coordination for decremation, primarily due to apraxia.  She is able to do hand opening and closing and finger taps bilaterally. Gait and Station: The patient is in a wheelchair and unable to use a walker per her husband.  She only walks with his assistance and even with assistance, she does not walk well.  She has dystonia of the feet and she trips over her feet.  ASSESSMENT/PLAN:  1.  Idiopathic Parkinson's disease, advance.  The patient has tremor, bradykinesia, rigidity and mild postural instability.  -We discussed the diagnosis as well as pathophysiology of the disease.  We discussed treatment options as well as prognostic indicators.  Patient education was provided.  -Continue carbidopa/levodopa 25/100, one tablet 3 times per day, but move 30 minutes prior to the mealtime  -continue carbidopa/levodopa 50/200 q hs  -discussed respite care for her husband.  I am most concerned about his well-being at this point given the advanced  state of the patient's disease.  I am going to look into some community resources for him, including [facility].  -lift chair RX given  -discussed hospital bed.  They are not interested in that right now.  -home health evaluation/PT home health.  [facility] has a dementia program and I will see if we can get them involved.  -I am going to have them make an appointment with my Parkinson Child psychotherapist.   2.  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  They can give her a half a tablet as needed during the daytime.  The patient likes to take her clothes off during the daytime, which can be very disturbing for the patient's husband.  I told him that he can try to put her button up shirt on backwards and see if that helps.  We also talked about trying to get the patient something like silly putty so that it keeps her hands occupied.  We also talked about using the gait belt around her waist, as it will be difficult for her to get her close off with that on.  In addition, I think the gait belt would be beneficial for her husband when he transfers her.  3.  I will see the patient back in the next 5 months, sooner should new neurologic issues arise.  Greater than 50% of the 40-minute visit was spent in counseling with the patient and her family.     Cc:  Oneal Grout, MD

## 2017-02-21 ENCOUNTER — Ambulatory Visit (INDEPENDENT_AMBULATORY_CARE_PROVIDER_SITE_OTHER): Payer: Medicare Other | Admitting: Neurology

## 2017-02-21 ENCOUNTER — Encounter: Payer: Self-pay | Admitting: Neurology

## 2017-02-21 VITALS — BP 114/66 | HR 94

## 2017-02-21 DIAGNOSIS — F02818 Dementia in other diseases classified elsewhere, unspecified severity, with other behavioral disturbance: Secondary | ICD-10-CM

## 2017-02-21 DIAGNOSIS — G20 Parkinson's disease: Secondary | ICD-10-CM

## 2017-02-21 DIAGNOSIS — F0281 Dementia in other diseases classified elsewhere with behavioral disturbance: Secondary | ICD-10-CM | POA: Diagnosis not present

## 2017-02-21 MED ORDER — AMBULATORY NON FORMULARY MEDICATION: 0 refills | Status: DC

## 2017-02-21 NOTE — Patient Instructions (Signed)
Parkinson's Caregiver Group   Save the Date: First meeting  will be on April 17, 2017  from 2-3:30  Parkinson's disease can be challenging for caregivers too. Changing  abilities and assuming new roles within the family can cause emotional  upheaval. Meeting with a group of peers who are experiencing similar changes is very beneficial for any caregiver. This professionally led support group will provide a safe place for Parkinson's caregivers to connect, share challenges, seek solutions, learn about resources and strengthen coping skills.    When:  Last Monday of the month (unless date falls on a holiday)  2019 Dates: 1/28, 2/25, 3/25, 4/29, 5/20, 6/24, 7/29, 8/26, 9/30, 10/28, 11/25, 12/30   Location: [facility]                                                                                              [address]                                                                                                 [place], [place] [address]                                                                                      Room 204 Time: 2-3:30   Please call Jessica Thomas, MSW, LCSW at [phone] with any questions.    

## 2017-02-22 ENCOUNTER — Telehealth: Payer: Self-pay | Admitting: Neurology

## 2017-02-22 ENCOUNTER — Telehealth: Payer: Self-pay | Admitting: Psychology

## 2017-02-22 NOTE — Telephone Encounter (Signed)
Tc and left a message with daughter Lanora Manislizabeth  To schedule a meeting with both she and her father to get them resources. I suggested meeting over Christmas break as patient's daughter is a full time substitute Runner, broadcasting/film/videoteacher.

## 2017-02-22 NOTE — Telephone Encounter (Signed)
Referral faxed to [facility] for home health memory care clinic. Faxed to [phone] with confirmation received. They will call patient to arrange.

## 2017-02-22 NOTE — Telephone Encounter (Signed)
Tc with patient's daughter. I will meet with she and her father on Thursday, December 27 at 10:30 to go over resources and services that will help the caregiver and patient.

## 2017-02-23 ENCOUNTER — Telehealth: Payer: Self-pay | Admitting: Neurology

## 2017-02-23 NOTE — Telephone Encounter (Signed)
Spoke with Misty StanleyLisa. She forwarded referral to [facility], who she states has a memory care program as well.   [facility] [place] [facility] She states they don't have a memory care specific program, but do work with a lot seniors with memory care issues. They will send in social work, nursing, PT/OT/ST to patient's home. Will call with any issues.

## 2017-02-23 NOTE — Telephone Encounter (Signed)
Lisa Henry from [facility] left a VM message saying they were out of network for pt and needed to refer her else where CB# [phone]

## 2017-02-24 ENCOUNTER — Telehealth: Payer: Self-pay | Admitting: Neurology

## 2017-02-24 NOTE — Telephone Encounter (Signed)
Doug physical therapist with [facility] called in regards to pt and would like a call back [phone]

## 2017-02-24 NOTE — Telephone Encounter (Signed)
Spoke with PT. He states he had a long talk/assessment with the patient and family today. Talked about ways he could help with safety, etc. He states family does not want to proceed with services.

## 2017-03-16 ENCOUNTER — Encounter: Payer: Self-pay | Admitting: Psychology

## 2017-03-16 NOTE — Progress Notes (Signed)
I met with the patient's husband, daughter and son in law today.  They asked if Dr. Carles Collet would order a PT/OT home evaluation since they are in a new environment.  More than anything I think is due to concerned about the caregiver safety with transferring etc. I told them that I would pass this along to Dr. Carles Collet and Luvenia Starch.   I met with the caregiver today to talk about respite options such as adult care centers, private pay respite caregivers.  In addition, I talked with him a little bit about utilizing the services that they have a [facility].  The husband had stated that they had brought into the [facility].  I explained that it may be good for them just to talk with the social worker there to help problem solve the higher level of care needs for the patient and the independent living status and respite care needs for the caregiver.  After leaving my office, they were planning on stopping by [facility] to talk with someone there.

## 2017-03-17 ENCOUNTER — Telehealth: Payer: Self-pay | Admitting: Neurology

## 2017-03-17 NOTE — Telephone Encounter (Signed)
-----  Message from Strum, DO sent at 03/17/2017 12:47 PM EST ----- Regarding: RE: PT/OT eval  ok ----- Message ----- From: Annamaria Helling, CMA Sent: 03/17/2017   8:18 AM To: Ludwig Clarks, DO, Natividad Brood, LCSW Subject: RE: PT/OT eval                                 Patient had evaluation at beginning of December and refused home health services.  Dr. Carles Collet - do you want me to order again?   ----- Message ----- From: Natividad Brood, LCSW Sent: 03/16/2017   1:16 PM To: Annamaria Helling, CMA Subject: PT/OT eval                                     I met with the patient's husband, daughter and son in law today.  They asked if Dr. Carles Collet would order a PT/OT home evaluation since they are in a new environment.  More than anything I think is due to concerned about the caregiver safety with transferring etc. I told them that I would pass this along.  I met with him today to talk about respite options such as adult care centers, private pay respite caregivers.  In addition, I talked with him a little bit about utilizing the services that they have a [facility].  The husband had stated that they had brought into the [facility].  I explained that it may be good for them just to talk with the social worker there to help problem solve the higher level of care needs for the patient and the independent living status and respite care needs for the caregiver.  After leaving my office, they were planning on stopping by [facility] to talk with someone there.

## 2017-03-17 NOTE — Telephone Encounter (Signed)
Spoke with Huntley DecSara at [facility] [phone]) and reordered PT/OT safety evaluations.

## 2017-03-22 ENCOUNTER — Telehealth: Payer: Self-pay | Admitting: Psychology

## 2017-03-22 NOTE — Telephone Encounter (Signed)
I received a [facility] stating that a [facility] therapy order was received and a appointment was scheduled for today to meet with the patient and the caregiver.  Before [facility] went out to the appointment they confirmed with the caregiver that  it was still a good time to come out.  Upon arrival, the patient's husband/caregiver said that there is not really anything they can do at this time and that they are looking into respite care.  He is not interested in physical therapy for the patient at this time.  They offered caregiver training to help with appropriate movements and transfers of the patient and he is not interested at this time.  This is a second physical therapy order in about 6-7 weeks with the same outcome.  I contacted the patient's daughter Lisa Henry to let her know the outcome and why physical therapy will not be able to provide services at this time.

## 2017-04-05 ENCOUNTER — Non-Acute Institutional Stay: Payer: Medicare Other | Admitting: Internal Medicine

## 2017-04-05 ENCOUNTER — Encounter: Payer: Self-pay | Admitting: Internal Medicine

## 2017-04-05 VITALS — BP 118/68 | HR 60 | Temp 97.5°F | Resp 16 | Ht 64.0 in | Wt 93.6 lb

## 2017-04-05 DIAGNOSIS — E43 Unspecified severe protein-calorie malnutrition: Secondary | ICD-10-CM

## 2017-04-05 DIAGNOSIS — G20 Parkinson's disease: Secondary | ICD-10-CM | POA: Diagnosis not present

## 2017-04-05 DIAGNOSIS — R2681 Unsteadiness on feet: Secondary | ICD-10-CM | POA: Diagnosis not present

## 2017-04-05 DIAGNOSIS — E039 Hypothyroidism, unspecified: Secondary | ICD-10-CM

## 2017-04-05 DIAGNOSIS — F0281 Dementia in other diseases classified elsewhere with behavioral disturbance: Secondary | ICD-10-CM | POA: Diagnosis not present

## 2017-04-05 DIAGNOSIS — R32 Unspecified urinary incontinence: Secondary | ICD-10-CM

## 2017-04-05 DIAGNOSIS — N390 Urinary tract infection, site not specified: Secondary | ICD-10-CM

## 2017-04-05 DIAGNOSIS — R471 Dysarthria and anarthria: Secondary | ICD-10-CM | POA: Diagnosis not present

## 2017-04-05 DIAGNOSIS — F02818 Dementia in other diseases classified elsewhere, unspecified severity, with other behavioral disturbance: Secondary | ICD-10-CM

## 2017-04-05 DIAGNOSIS — Z872 Personal history of diseases of the skin and subcutaneous tissue: Secondary | ICD-10-CM

## 2017-04-05 DIAGNOSIS — Z7189 Other specified counseling: Secondary | ICD-10-CM

## 2017-04-05 DIAGNOSIS — G20A1 Parkinson's disease without dyskinesia, without mention of fluctuations: Secondary | ICD-10-CM

## 2017-04-05 NOTE — Progress Notes (Signed)
[facility]  Provider: Blanchie Serve MD   Location:  [place] of Service:  Clinic (12)  PCP: Blanchie Serve, MD Patient Care Team: Blanchie Serve, MD as PCP - General (Internal Medicine)  Extended Emergency Contact Information Primary Emergency Contact: Hester Mates Address: [address]          [place], [place] [address] Johnnette Litter of [place] Phone: [phone] Mobile Phone: [phone] Relation: Spouse Secondary Emergency Contact: [place] of [place] Mobile Phone: [phone] Relation: Other  Goals of Care: Advanced Directive information Advanced Directives 04/05/2017  Does Patient Have a Medical Advance Directive? Yes  Type of Paramedic of [place];Living will;Out of facility DNR (pink MOST or yellow form)  Does patient want to make changes to medical advance directive? Yes (MAU/Ambulatory/Procedural Areas - Information given)  Copy of [place] in Chart? Yes      Chief Complaint  Patient presents with  . Medical Management of Chronic Issues    routine follow up  . Medication Refill    No refills needed at this time    HPI: Patient is a 84 y.o. female seen today for routine visit. She resides in independent living facility with her husband.  Parkinson's disease- takes sinemet cr 50-200 mg daily and 25-100 mg tid. On wheelchair for ambulation and needs to be wheeled around. Husband is the main caretaker. Poor dentition, cant tolerate dentures. Cutting foot in small pieces helps, taking regular textures. No choking, no cough.  Dysarthria- able to express herself some, getting supportive care.  Unsteady gait-  No fall reported. Wheelchair for ambulation, 1 person assistance with transfer.  Dementia with behavior disturbance- with parkinson's advanced. Supportive care and sinemet. Tolerating seroquel well.  Hypothyroidism- takes levothyroxine  88 mcg daily.   UI- Has depends, no rash mentioned. Currently on tolterodine 4 mg daily. Has history of prolapse and was using pessary before. Not at present.   UTI prophylaxis- with history of UI and recurrent UTI, currently on trimethoprim 100 mg daily  Past Medical History:  Diagnosis Date  . COPD (chronic obstructive pulmonary disease) ([place])   . Dysarthria   . Falls   . Gastroenteritis   . Hypothyroid   . Incontinence   . Parkinson disease ([place])    with dementia and hallucinations  . Rectal prolapse   . Recurrent UTI    Past Surgical History:  Procedure Laterality Date  . CATARACT EXTRACTION, BILATERAL    . CHOLECYSTECTOMY, LAPAROSCOPIC  2015   Dr. Dayton Martes   . FEMUR FRACTURE SURGERY    . REPAIR OF RECTAL PROLAPSE    . TONSILLECTOMY AND ADENOIDECTOMY      reports that  has never smoked. she has never used smokeless tobacco. She reports that she does not drink alcohol or use drugs. Social History   Socioeconomic History  . Marital status: Married    Spouse name: Not on file  . Number of children: Not on file  . Years of education: Not on file  . Highest education level: Not on file  Social Needs  . Financial resource strain: Not on file  . Food insecurity - worry: Not on file  . Food insecurity - inability: Not on file  . Transportation needs - medical: Not on file  . Transportation needs - non-medical: Not on file  Occupational History  . Not on file  Tobacco Use  . Smoking status: Never Smoker  .  Smokeless tobacco: Never Used  Substance and Sexual Activity  . Alcohol use: No  . Drug use: No  . Sexual activity: Not on file  Other Topics Concern  . Not on file  Social History Narrative   Moved to [facility] 05/27/2016   No tobacco use ever   . No alcohol use.    Does drinks/ eats things with caffeine.    Married in [place] lives in a 3 story apartment building with 2 other people and no pets.    Current or past profession; day care worker.    Does not  exercise.    Has a living will, DNR and POA/HPOA.     Functional Status Survey:    Family History  Problem Relation Age of Onset  . Heart failure Mother 73  . Lymphoma Father 36  . Parkinson's disease Brother   . Brain cancer Brother 30    Health Maintenance  Topic Date Due  . Samul Dada  01/29/1952  . DEXA SCAN  01/30/2000  . PNA vac Low Risk Adult (1 of 2 - PCV13) 01/30/2000  . INFLUENZA VACCINE  Completed    No Known Allergies  Outpatient Encounter Medications as of 04/05/2017  Medication Sig  . AMBULATORY NON FORMULARY MEDICATION Lift Chair Dx: G20  . carbidopa-levodopa (SINEMET CR) 50-200 MG tablet Take 1 tablet by mouth at bedtime.  . carbidopa-levodopa (SINEMET IR) 25-100 MG tablet Take 1 tablet by mouth 3 (three) times daily.  Marland Kitchen ENSURE (ENSURE) Take 237 mLs by mouth. One daily with lunch  . levothyroxine (SYNTHROID, LEVOTHROID) 88 MCG tablet Take 1 tablet (88 mcg total) by mouth daily.  . QUEtiapine (SEROQUEL) 25 MG tablet Take 1 tablet (25 mg total) by mouth at bedtime.  . tolterodine (DETROL LA) 4 MG 24 hr capsule Take 4 mg by mouth daily.  Marland Kitchen trimethoprim (TRIMPEX) 100 MG tablet Take 100 mg by mouth daily.   No facility-administered encounter medications on file as of 04/05/2017.     Review of Systems  Unable to perform ROS: Dementia (limited with her dysarthria and dementia, husband provides most of HPI and ROS)  Constitutional: Negative for appetite change, chills, fatigue and fever.  HENT: Positive for rhinorrhea and trouble swallowing. Negative for congestion, ear discharge, ear pain and nosebleeds.   Eyes: Positive for visual disturbance.       Has reading glasses  Respiratory: Negative for cough, choking and shortness of breath.   Cardiovascular: Negative for chest pain.  Gastrointestinal: Negative for abdominal pain, blood in stool, constipation, diarrhea, nausea and vomiting.  Genitourinary: Negative for dysuria and hematuria.       Has UI, history  of uterine prolpase  Musculoskeletal: Positive for gait problem. Negative for arthralgias, back pain and joint swelling.  Skin: Positive for wound. Negative for rash.  Neurological: Positive for tremors and speech difficulty. Negative for dizziness and headaches.  Hematological: Bruises/bleeds easily.  Psychiatric/Behavioral: Positive for behavioral problems, confusion and hallucinations. Negative for sleep disturbance.    Vitals:   04/05/17 1122  BP: 118/68  Pulse: 60  Resp: 16  Temp: (!) 97.5 F (36.4 C)  TempSrc: Oral  SpO2: 99%  Weight: 93 lb 9.6 oz (42.5 kg)  Height: '5\' 4"'  (1.626 m)   Body mass index is 16.07 kg/m.   Wt Readings from Last 3 Encounters:  04/05/17 93 lb 9.6 oz (42.5 kg)  11/17/16 86 lb 9 oz (39.3 kg)  07/12/16 89 lb (40.4 kg)   Physical Exam  Constitutional:  No distress.  Thin built, frail  HENT:  Head: Normocephalic and atraumatic.  Mouth/Throat: Oropharynx is clear and moist. No oropharyngeal exudate.  Missing teeth  Eyes: Conjunctivae and EOM are normal. Pupils are equal, round, and reactive to light. Right eye exhibits no discharge. Left eye exhibits no discharge.  Neck: Normal range of motion. Neck supple.  Cardiovascular: Normal rate and regular rhythm.  Pulmonary/Chest: Effort normal and breath sounds normal. No respiratory distress. She has no wheezes. She has no rales. She exhibits no tenderness.  Kyphosis and scoliosis present  Abdominal: Soft. Bowel sounds are normal. There is no tenderness.  Musculoskeletal: She exhibits deformity. She exhibits no edema.  Can move all 4 extremities, unsteady gait, uses wheelchair and husband wheels her around, needs 1 person max assist with transfer  Lymphadenopathy:    She has no cervical adenopathy.  Neurological: She is alert.  Oriented to person, dysarthria present, follows simple commands  Skin: Skin is warm and dry. No rash noted. She is not diaphoretic.  Psychiatric: She has a normal mood and  affect.    Labs reviewed: Basic Metabolic Panel: No results for input(s): NA, K, CL, CO2, GLUCOSE, BUN, CREATININE, CALCIUM, MG, PHOS in the last 8760 hours. Liver Function Tests: No results for input(s): AST, ALT, ALKPHOS, BILITOT, PROT, ALBUMIN in the last 8760 hours. No results for input(s): LIPASE, AMYLASE in the last 8760 hours. No results for input(s): AMMONIA in the last 8760 hours. CBC: No results for input(s): WBC, NEUTROABS, HGB, HCT, MCV, PLT in the last 8760 hours. Cardiac Enzymes: No results for input(s): CKTOTAL, CKMB, CKMBINDEX, TROPONINI in the last 8760 hours. BNP: Invalid input(s): POCBNP Lab Results  Component Value Date   HGBA1C 6.2 12/22/2015   No results found for: TSH No results found for: VITAMINB12 No results found for: FOLATE No results found for: IRON, TIBC, FERRITIN  Lipid Panel: No results for input(s): CHOL, HDL, LDLCALC, TRIG, CHOLHDL, LDLDIRECT in the last 8760 hours. Lab Results  Component Value Date   HGBA1C 6.2 12/22/2015    Procedures since last visit: No results found.  Assessment/Plan  1. Dementia due to Parkinson's disease with behavioral disturbance (HCC) Continue sinemet IR and CR current regimen, supportive care. seroquel for behavioral issues. Filled out day activities form [facility].  - CBC with Differential/Platelets; Future  2. Hypothyroidism, unspecified type Continue levothyroxine for now.  - CMP with eGFR([facility]); Future - TSH; Future - CBC with Differential/Platelets; Future - Lipid Panel; Future  3. Parkinson disease ([place]) Continue sinemet current regimen, supportive care - CBC with Differential/Platelets; Future - Lipid Panel; Future  4. Urinary incontinence, unspecified type Continue detrol and monitor. Perineal care. - CBC with Differential/Platelets; Future  5. Dysarthria With parkinson's disease. Supportive care.   6. Unsteady gait Wheelchair for ambulation, fall risk present  7.  Recurrent UTI Continue trimethoprim  8. Goals of care, counseling/discussion Reviewed goals of care with patient's husband this visit. Went over her living will and [place] paperwork. Went over DNR form and MOST form, filled it out after reviewing goals of care from 12 pm to 12:30 pm. Patient does not want to be resuscitated in absence of pulse or breathing. Husband would like for her to be sent to hospital if needed with focus on limited interventions along with comfort measures. No intubation or mechanical ventilation. No cardioversion. Antibiotic and fluids for defined trial period if needed. Patient and husband does not want feeding tube. Form reviewed and signed by husband/HCPOA.  Copies made to be scanned in patient's chart.   9. History of pressure ulcer Healed area to left buttock. Advised to use wedge cushion to help relief pressure from her bottom while sitting on wheelchair   10. Severe protein-calorie malnutrition ([place]) Has gained few pounds, supportive care and protein supplement    Labs/tests ordered:   Lab Orders     CMP with eGFR([facility])     TSH     CBC with Differential/Platelets     Lipid Panel  Next appointment: 3 months  Communication: reviewed care plan with patient and her husband   Blanchie Serve, MD Internal Medicine [address], [place] [address] Cell Phone (Monday-Friday 8 am - 5 pm): [phone] On Call: [phone] and follow prompts after 5 pm and on weekends Office Phone: [phone] Office Fax: [phone]

## 2017-04-10 ENCOUNTER — Other Ambulatory Visit: Payer: Medicare Other

## 2017-04-13 DIAGNOSIS — E039 Hypothyroidism, unspecified: Secondary | ICD-10-CM | POA: Diagnosis not present

## 2017-04-13 DIAGNOSIS — R32 Unspecified urinary incontinence: Secondary | ICD-10-CM | POA: Diagnosis not present

## 2017-04-13 DIAGNOSIS — F0281 Dementia in other diseases classified elsewhere with behavioral disturbance: Secondary | ICD-10-CM | POA: Diagnosis not present

## 2017-04-13 DIAGNOSIS — G20 Parkinson's disease: Secondary | ICD-10-CM | POA: Diagnosis not present

## 2017-04-13 LAB — COMPLETE METABOLIC PANEL WITH GFR
AG Ratio: 1.3 (calc) (ref 1.0–2.5)
ALBUMIN MSPROF: 3.9 g/dL (ref 3.6–5.1)
ALKALINE PHOSPHATASE (APISO): 50 U/L (ref 33–130)
ALT: 3 U/L — ABNORMAL LOW (ref 6–29)
AST: 13 U/L (ref 10–35)
BUN/Creatinine Ratio: 34 (calc) — ABNORMAL HIGH (ref 6–22)
BUN: 32 mg/dL — ABNORMAL HIGH (ref 7–25)
CALCIUM: 9.3 mg/dL (ref 8.6–10.4)
CO2: 28 mmol/L (ref 20–32)
CREATININE: 0.95 mg/dL — AB (ref 0.60–0.88)
Chloride: 105 mmol/L (ref 98–110)
GFR, EST NON AFRICAN AMERICAN: 56 mL/min/{1.73_m2} — AB (ref >=60)
GFR, Est African American: 65 mL/min/{1.73_m2} (ref >=60)
Globulin: 3 g/dL (calc) (ref 1.9–3.7)
Glucose, Bld: 79 mg/dL (ref 65–99)
Potassium: 4.2 mmol/L (ref 3.5–5.3)
Sodium: 141 mmol/L (ref 135–146)
Total Bilirubin: 0.5 mg/dL (ref 0.2–1.2)
Total Protein: 6.9 g/dL (ref 6.1–8.1)

## 2017-04-13 LAB — CBC WITH DIFFERENTIAL/PLATELET
BASOS PCT: 0.9 %
Basophils Absolute: 62 cells/uL (ref 0–200)
EOS PCT: 2.7 %
Eosinophils Absolute: 186 cells/uL (ref 15–500)
HEMATOCRIT: 39.8 % (ref 35.0–45.0)
Hemoglobin: 12.9 g/dL (ref 11.7–15.5)
LYMPHS ABS: 1863 {cells}/uL (ref 850–3900)
MCH: 28.8 pg (ref 27.0–33.0)
MCHC: 32.4 g/dL (ref 32.0–36.0)
MCV: 88.8 fL (ref 80.0–100.0)
MPV: 11.5 fL (ref 7.5–12.5)
Monocytes Relative: 6.4 %
NEUTROS ABS: 4347 {cells}/uL (ref 1500–7800)
NEUTROS PCT: 63 %
PLATELETS: 245 10*3/uL (ref 140–400)
RBC: 4.48 10*6/uL (ref 3.80–5.10)
RDW: 12.5 % (ref 11.0–15.0)
Total Lymphocyte: 27 %
WBC: 6.9 10*3/uL (ref 3.8–10.8)
WBCMIX: 442 {cells}/uL (ref 200–950)

## 2017-04-13 LAB — LIPID PANEL
Cholesterol: 168 mg/dL (ref <200)
HDL: 46 mg/dL — ABNORMAL LOW (ref >50)
LDL Cholesterol (Calc): 102 mg/dL (calc) — ABNORMAL HIGH
NON-HDL CHOLESTEROL (CALC): 122 mg/dL (ref <130)
TRIGLYCERIDES: 108 mg/dL (ref <150)
Total CHOL/HDL Ratio: 3.7 (calc) (ref <5.0)

## 2017-04-13 LAB — TSH: TSH: 0.16 mIU/L — ABNORMAL LOW (ref 0.40–4.50)

## 2017-04-19 ENCOUNTER — Other Ambulatory Visit: Payer: Self-pay

## 2017-04-19 MED ORDER — LEVOTHYROXINE SODIUM 75 MCG PO TABS: 75.0000 ug | ORAL_TABLET | Freq: Every day | ORAL | 3 refills | Status: DC

## 2017-05-10 ENCOUNTER — Other Ambulatory Visit: Payer: Self-pay | Admitting: Neurology

## 2017-05-10 MED ORDER — CARBIDOPA-LEVODOPA ER 50-200 MG PO TBCR: 1.0000 | EXTENDED_RELEASE_TABLET | Freq: Every day | ORAL | 0 refills | Status: DC

## 2017-05-12 DIAGNOSIS — B351 Tinea unguium: Secondary | ICD-10-CM | POA: Diagnosis not present

## 2017-05-12 DIAGNOSIS — M79672 Pain in left foot: Secondary | ICD-10-CM | POA: Diagnosis not present

## 2017-05-12 DIAGNOSIS — L84 Corns and callosities: Secondary | ICD-10-CM | POA: Diagnosis not present

## 2017-05-12 DIAGNOSIS — M79671 Pain in right foot: Secondary | ICD-10-CM | POA: Diagnosis not present

## 2017-05-23 ENCOUNTER — Other Ambulatory Visit: Payer: Self-pay

## 2017-05-23 DIAGNOSIS — G20 Parkinson's disease: Secondary | ICD-10-CM

## 2017-05-23 DIAGNOSIS — R32 Unspecified urinary incontinence: Secondary | ICD-10-CM

## 2017-05-23 DIAGNOSIS — G20A1 Parkinson's disease without dyskinesia, without mention of fluctuations: Secondary | ICD-10-CM

## 2017-05-23 DIAGNOSIS — E039 Hypothyroidism, unspecified: Secondary | ICD-10-CM

## 2017-05-23 DIAGNOSIS — F0281 Dementia in other diseases classified elsewhere with behavioral disturbance: Secondary | ICD-10-CM

## 2017-05-23 DIAGNOSIS — F02818 Dementia in other diseases classified elsewhere, unspecified severity, with other behavioral disturbance: Secondary | ICD-10-CM

## 2017-05-31 DIAGNOSIS — F0281 Dementia in other diseases classified elsewhere with behavioral disturbance: Secondary | ICD-10-CM | POA: Diagnosis not present

## 2017-05-31 DIAGNOSIS — G20 Parkinson's disease: Secondary | ICD-10-CM | POA: Diagnosis not present

## 2017-05-31 DIAGNOSIS — R32 Unspecified urinary incontinence: Secondary | ICD-10-CM | POA: Diagnosis not present

## 2017-05-31 DIAGNOSIS — E039 Hypothyroidism, unspecified: Secondary | ICD-10-CM | POA: Diagnosis not present

## 2017-06-01 ENCOUNTER — Other Ambulatory Visit: Payer: Self-pay | Admitting: Neurology

## 2017-06-01 ENCOUNTER — Telehealth: Payer: Self-pay | Admitting: Neurology

## 2017-06-01 DIAGNOSIS — F0281 Dementia in other diseases classified elsewhere with behavioral disturbance: Secondary | ICD-10-CM

## 2017-06-01 DIAGNOSIS — E039 Hypothyroidism, unspecified: Secondary | ICD-10-CM

## 2017-06-01 DIAGNOSIS — R32 Unspecified urinary incontinence: Secondary | ICD-10-CM

## 2017-06-01 DIAGNOSIS — G20 Parkinson's disease: Secondary | ICD-10-CM

## 2017-06-01 LAB — CBC WITH DIFFERENTIAL/PLATELET
BASOS PCT: 1.3 %
Basophils Absolute: 82 cells/uL (ref 0–200)
EOS PCT: 3.2 %
Eosinophils Absolute: 202 cells/uL (ref 15–500)
HCT: 38.8 % (ref 35.0–45.0)
Hemoglobin: 13 g/dL (ref 11.7–15.5)
Lymphs Abs: 2186 cells/uL (ref 850–3900)
MCH: 29.6 pg (ref 27.0–33.0)
MCHC: 33.5 g/dL (ref 32.0–36.0)
MCV: 88.4 fL (ref 80.0–100.0)
MPV: 11.6 fL (ref 7.5–12.5)
Monocytes Relative: 7.1 %
NEUTROS PCT: 53.7 %
Neutro Abs: 3383 cells/uL (ref 1500–7800)
PLATELETS: 231 10*3/uL (ref 140–400)
RBC: 4.39 10*6/uL (ref 3.80–5.10)
RDW: 12.7 % (ref 11.0–15.0)
TOTAL LYMPHOCYTE: 34.7 %
WBC: 6.3 10*3/uL (ref 3.8–10.8)
WBCMIX: 447 {cells}/uL (ref 200–950)

## 2017-06-01 LAB — COMPLETE METABOLIC PANEL WITH GFR
AG RATIO: 1.2 (calc) (ref 1.0–2.5)
ALBUMIN MSPROF: 3.7 g/dL (ref 3.6–5.1)
ALT: <3 U/L — ABNORMAL LOW (ref 6–29)
AST: 15 U/L (ref 10–35)
Alkaline phosphatase (APISO): 45 U/L (ref 33–130)
BILIRUBIN TOTAL: 0.6 mg/dL (ref 0.2–1.2)
BUN / CREAT RATIO: 35 (calc) — AB (ref 6–22)
BUN: 33 mg/dL — ABNORMAL HIGH (ref 7–25)
CALCIUM: 9.6 mg/dL (ref 8.6–10.4)
CHLORIDE: 107 mmol/L (ref 98–110)
CO2: 27 mmol/L (ref 20–32)
Creat: 0.94 mg/dL — ABNORMAL HIGH (ref 0.60–0.88)
GFR, EST AFRICAN AMERICAN: 65 mL/min/{1.73_m2} (ref >=60)
GFR, EST NON AFRICAN AMERICAN: 56 mL/min/{1.73_m2} — AB (ref >=60)
GLOBULIN: 3 g/dL (ref 1.9–3.7)
Glucose, Bld: 87 mg/dL (ref 65–99)
POTASSIUM: 4.4 mmol/L (ref 3.5–5.3)
SODIUM: 141 mmol/L (ref 135–146)
TOTAL PROTEIN: 6.7 g/dL (ref 6.1–8.1)

## 2017-06-01 LAB — LIPID PANEL
CHOLESTEROL: 170 mg/dL (ref <200)
HDL: 43 mg/dL — AB (ref >50)
LDL Cholesterol (Calc): 108 mg/dL (calc) — ABNORMAL HIGH
NON-HDL CHOLESTEROL (CALC): 127 mg/dL (ref <130)
TRIGLYCERIDES: 92 mg/dL (ref <150)
Total CHOL/HDL Ratio: 4 (calc) (ref <5.0)

## 2017-06-01 LAB — TSH: TSH: 0.22 m[IU]/L — AB (ref 0.40–4.50)

## 2017-06-01 MED ORDER — CARBIDOPA-LEVODOPA 25-100 MG PO TABS: 1.0000 | ORAL_TABLET | Freq: Three times a day (TID) | ORAL | 0 refills | Status: DC

## 2017-06-01 NOTE — Telephone Encounter (Signed)
Just received request this morning and sent medication to the pharmacy.

## 2017-06-01 NOTE — Telephone Encounter (Signed)
Pt's spouse left a VM message saying the pharmacy faxed over a request for a refill of [place] on 05/17/2017 and the pharmacy has not received a response yet, please advise

## 2017-06-05 ENCOUNTER — Other Ambulatory Visit: Payer: Self-pay

## 2017-06-05 DIAGNOSIS — E039 Hypothyroidism, unspecified: Secondary | ICD-10-CM

## 2017-06-05 MED ORDER — LEVOTHYROXINE SODIUM 50 MCG PO TABS: 50.0000 ug | ORAL_TABLET | Freq: Every day | ORAL | 3 refills | Status: DC

## 2017-07-03 NOTE — Progress Notes (Signed)
Lisa Henry was seen today in the movement disorders clinic for neurologic consultation at the request of Blanchie Serve, MD.  The consultation is for the evaluation of PD.  Dx was apparently made on Oct 1,1995.  First sx was R leg tremor.  Husband states that carbidopa/levodopa and requip were started at same time and she did well with these meds.  Husband unsure of why requip was taken off (presumably memory change but he states it was years ago).  She is on carbidopa/levodopa 25/100, qid (mealtime and bedtime).  She is also on selegeline, 5 mg, 2 po q am.    No prior neuro records are available.    11/17/16 update:  Patient seen today in follow-up for her Parkinson's disease.  She is accompanied by her husband and daughter who supplements the history.  The patient is on carbidopa/levodopa 25/100, one tablet 3 times per day.  I changed her nighttime levodopa to carbidopa/levodopa 50/200.  I did this in hopes that it would help her 3 AM tremor.  Her husband states that this is still there.  She is still on selegiline 5 mg and takes 2 tablets in the morning.  In regards to hallucinations, she still has these.  She doesn't know that they aren't real.  More fearful of these.   She has illusions as well.  She has had no lightheadedness or near syncope.  No falls since our last visit.  No new medical issues have arisen.  She has some nightmares at night.    02/21/17 update: Patient seen today in follow-up for her Parkinson's disease with Parkinson's dementia.  She is accompanied by her husband and daughter who supplement the history.  She is on carbidopa/levodopa 25/100, 1 tablet 3 times per day and carbidopa/levodopa 50/200 at night.  We discontinued her selegiline last visit.  We added quetiapine.  She is on 25 mg at night.  She is less agitated, but still has hallucinations.  Her family did not want to add daytime quetiapine.  Sleeping is better.  Hallucinations are there most of the time but she isn't  agitated by them and haven't been disturbing to patient or family.  Some illusions as well.  Husband has very little respite care.  On Thursdays, he has someone for 2 hours/day 1 week and 3 hours the next day.  His daughter is concerned that that is not enough.  They also are not well equipped within the home, although they do live within senior living.  They do live independently.  07/04/17 update: Patient is seen today in follow-up for Parkinson's disease with associated Parkinson's disease dementia.  She is accompanied by her husband who supplements the history.  Patient remains on carbidopa/levodopa 25/100, 1 tablet 3 times per day as well as carbidopa/levodopa 50/200 at bedtime.  The patient does have hallucinations and associated agitation, so she is on quetiapine 25 mg at night and I told the patient's husband/daughter that they could use half a tablet during the day.  Today, they state that she isn't getting any in the day.  Mood has been "about the same."  Hallucinations are present but better than in the past.  They have met independently with my social worker since last visit.  Records are reviewed from that visit.  Patient was referred for home health services, but refused that and then when they met with my social worker asked for those services again.  When the home health therapist got to the home, services  were refused again.  Husband states that she likes to get on the floor and crawl on the floor.  No falls but generally doesn't walk without her husband.    PREVIOUS MEDICATIONS: Sinemet, Requip, Eldpryl and nuplazid  ALLERGIES:  No Known Allergies  CURRENT MEDICATIONS:  Outpatient Encounter Medications as of 07/04/2017  Medication Sig  . carbidopa-levodopa (SINEMET CR) 50-200 MG tablet Take 1 tablet by mouth at bedtime.  . carbidopa-levodopa (SINEMET IR) 25-100 MG tablet Take 1 tablet by mouth 3 (three) times daily.  Marland Kitchen ENSURE (ENSURE) Take 237 mLs by mouth. One daily with lunch  .  levothyroxine (SYNTHROID, LEVOTHROID) 50 MCG tablet Take 1 tablet (50 mcg total) by mouth daily.  . QUEtiapine (SEROQUEL) 25 MG tablet Take 1 tablet (25 mg total) by mouth at bedtime.  . tolterodine (DETROL LA) 4 MG 24 hr capsule Take 4 mg by mouth daily.  Marland Kitchen trimethoprim (TRIMPEX) 100 MG tablet Take 100 mg by mouth daily.  . [DISCONTINUED] QUEtiapine (SEROQUEL) 25 MG tablet Take 1 tablet (25 mg total) by mouth at bedtime.  . [DISCONTINUED] AMBULATORY NON FORMULARY MEDICATION Lift Chair Dx: G20   No facility-administered encounter medications on file as of 07/04/2017.     PAST MEDICAL HISTORY:   Past Medical History:  Diagnosis Date  . COPD (chronic obstructive pulmonary disease) ([place])   . Dysarthria   . Falls   . Gastroenteritis   . Hypothyroid   . Incontinence   . Parkinson disease ([place])    with dementia and hallucinations  . Rectal prolapse   . Recurrent UTI     PAST SURGICAL HISTORY:   Past Surgical History:  Procedure Laterality Date  . CATARACT EXTRACTION, BILATERAL    . CHOLECYSTECTOMY, LAPAROSCOPIC  2015   Dr. Dayton Martes   . FEMUR FRACTURE SURGERY    . REPAIR OF RECTAL PROLAPSE    . TONSILLECTOMY AND ADENOIDECTOMY      SOCIAL HISTORY:   Social History   Socioeconomic History  . Marital status: Married    Spouse name: Not on file  . Number of children: Not on file  . Years of education: Not on file  . Highest education level: Not on file  Occupational History  . Not on file  Social Needs  . Financial resource strain: Not on file  . Food insecurity:    Worry: Not on file    Inability: Not on file  . Transportation needs:    Medical: Not on file    Non-medical: Not on file  Tobacco Use  . Smoking status: Never Smoker  . Smokeless tobacco: Never Used  Substance and Sexual Activity  . Alcohol use: No  . Drug use: No  . Sexual activity: Not on file  Lifestyle  . Physical activity:    Days per week: Not on file    Minutes per session: Not on file  .  Stress: Not on file  Relationships  . Social connections:    Talks on phone: Not on file    Gets together: Not on file    Attends religious service: Not on file    Active member of club or organization: Not on file    Attends meetings of clubs or organizations: Not on file    Relationship status: Not on file  . Intimate partner violence:    Fear of current or ex partner: Not on file    Emotionally abused: Not on file    Physically abused: Not on file  Forced sexual activity: Not on file  Other Topics Concern  . Not on file  Social History Narrative   Moved to [facility] 05/27/2016   No tobacco use ever   . No alcohol use.    Does drinks/ eats things with caffeine.    Married in [place] lives in a 3 story apartment building with 2 other people and no pets.    Current or past profession; day care worker.    Does not exercise.    Has a living will, DNR and POA/HPOA.     FAMILY HISTORY:   Family Status  Relation Name Status  . Mother Davy Pique Deceased  . Father Lance Bosch Deceased  . Brother [employer]  . Daughter [employer]  . Son [employer]  . Brother Lynann Bologna Deceased  . Brother Roger Deceased  . Brother C. [employer]  . Daughter Manus Gunning  . Son Chrissie Noa Alive    ROS:  A complete 10 system review of systems was obtained and the patient is really unable to participate with this due to significant dementia.  PHYSICAL EXAMINATION:    VITALS:   Vitals:   07/04/17 1116  BP: 104/64  Pulse: 80  SpO2: 91%    GEN:  The patient appears stated age and is in NAD. HEENT:  Normocephalic, atraumatic.  The mucous membranes are very dry. The superficial temporal arteries are without ropiness or tenderness. CV:  RRR Lungs:  CTAB Neck/HEME:  There are no carotid bruits bilaterally.  Neurological examination:  Orientation: The patient is alert but is unable to participate with questions of orientation.  She does tell me that chocolate is her  favorite food.   Cranial nerves: There is good facial symmetry.  She tracks appropriately about the room, but does not participate with extraocular muscle testing.  Her speech is somewhat dysarthric and lacks spontaneity.  Hearing appears to be intact to conversational tone.   Sensation: Sensation is intact to light touch.  Does not participate with sensitive aspects of the sensory testing.  (Same as prior) Motor: Strength is at least antigravity 4.  Does not participate with manual muscle testing.  (Same as prior)   Movement examination: Tone: There is increased tone in the bilateral UE but there is a gegenhalten component Abnormal movements: There is no rest tremor today Coordination:  There is difficulty assessing coordination for decremation, primarily due to apraxia.  She is able to do hand opening and closing and finger taps bilaterally (same as previous). Gait and Station: The patient is in a wheelchair and no longer walks much due to instability/dystonia of the feet.  Labs:  Lab Results  Component Value Date   TSH 0.22 (L) 05/31/2017     Chemistry      Component Value Date/Time   NA 141 05/31/2017 0000   NA 140 12/22/2015   K 4.4 05/31/2017 0000   CL 107 05/31/2017 0000   CO2 27 05/31/2017 0000   BUN 33 (H) 05/31/2017 0000   BUN 25 (A) 12/22/2015   CREATININE 0.94 (H) 05/31/2017 0000   GLU 108 12/22/2015      Component Value Date/Time   CALCIUM 9.6 05/31/2017 0000   ALKPHOS 66 12/22/2015   AST 15 05/31/2017 0000   ALT <3 (L) 05/31/2017 0000   BILITOT 0.6 05/31/2017 0000       ASSESSMENT/PLAN:  1.  Idiopathic Parkinson's disease, advance.  The patient has tremor, bradykinesia, rigidity and mild postural instability.  -  We discussed the diagnosis as well as pathophysiology of the disease.  We discussed treatment options as well as prognostic indicators.  Patient education was provided.  -continue carbidopa/levodopa 25/100 1 po tid  -continue carbidopa/levodopa  50/200 q hs  -caregiving burden growing but husband doing a great job and doesn't want to outsource this   2.  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  Still taking off clothes in day but they didn't try methods discussed last visit to help or to try start small doses of Seroquel if needed for agitation in the daytime.  This is still an option if needed in the future.  3.  Follow-up in 6 months, sooner should new neurologic issues arise.     Cc:  Blanchie Serve, MD

## 2017-07-04 ENCOUNTER — Encounter: Payer: Self-pay | Admitting: Neurology

## 2017-07-04 ENCOUNTER — Ambulatory Visit (INDEPENDENT_AMBULATORY_CARE_PROVIDER_SITE_OTHER): Payer: Medicare Other | Admitting: Neurology

## 2017-07-04 VITALS — BP 104/64 | HR 80

## 2017-07-04 DIAGNOSIS — F02818 Dementia in other diseases classified elsewhere, unspecified severity, with other behavioral disturbance: Secondary | ICD-10-CM

## 2017-07-04 DIAGNOSIS — G20 Parkinson's disease: Secondary | ICD-10-CM

## 2017-07-04 DIAGNOSIS — F0281 Dementia in other diseases classified elsewhere with behavioral disturbance: Secondary | ICD-10-CM

## 2017-07-04 MED ORDER — QUETIAPINE FUMARATE 25 MG PO TABS: 25.0000 mg | ORAL_TABLET | Freq: Every day | ORAL | 2 refills | Status: DC

## 2017-07-04 NOTE — Patient Instructions (Signed)
1.  Call me when your carbidopa/levodopa needs refilled   [facility]  The [facility] team know that living well with a movement disorder extends far beyond our clinic walls. We are together with you. Our team is passionate about providing resources to you and your loved ones who are living with Parkinson's disease and movement disorders. Participate in these programs and join our community. These resources are free or low cost!   [facility] is adding:   Innovative educational programs for patients and caregivers.   Support groups for patients and caregivers living with Parkinson's disease.   Parkinson's specific exercise programs.   Custom tailored therapeutic programs that will benefit patient's living with Parkinson's disease.   We are in this together. You can help and contribute to grow these programs and resources in our community. 100% of the funds donated to the Movement Disorders Fund stays right here in our community to support patients and their caregivers.  To make a tax deductible contribution:  -ask for a [facility]'s envelope in the office today.  - call the Office of Institutional Advancement at [phone].

## 2017-07-12 ENCOUNTER — Non-Acute Institutional Stay: Payer: Medicare Other | Admitting: Internal Medicine

## 2017-07-12 ENCOUNTER — Encounter: Payer: Self-pay | Admitting: Internal Medicine

## 2017-07-12 VITALS — BP 118/70 | HR 80 | Temp 97.4°F | Resp 16 | Ht 64.0 in | Wt 93.6 lb

## 2017-07-12 DIAGNOSIS — R32 Unspecified urinary incontinence: Secondary | ICD-10-CM

## 2017-07-12 DIAGNOSIS — N390 Urinary tract infection, site not specified: Secondary | ICD-10-CM | POA: Diagnosis not present

## 2017-07-12 DIAGNOSIS — M4003 Postural kyphosis, cervicothoracic region: Secondary | ICD-10-CM | POA: Diagnosis not present

## 2017-07-12 DIAGNOSIS — F0281 Dementia in other diseases classified elsewhere with behavioral disturbance: Secondary | ICD-10-CM | POA: Diagnosis not present

## 2017-07-12 DIAGNOSIS — R2681 Unsteadiness on feet: Secondary | ICD-10-CM | POA: Diagnosis not present

## 2017-07-12 DIAGNOSIS — G20 Parkinson's disease: Secondary | ICD-10-CM

## 2017-07-12 DIAGNOSIS — E039 Hypothyroidism, unspecified: Secondary | ICD-10-CM | POA: Diagnosis not present

## 2017-07-12 DIAGNOSIS — E43 Unspecified severe protein-calorie malnutrition: Secondary | ICD-10-CM

## 2017-07-12 DIAGNOSIS — G20A1 Parkinson's disease without dyskinesia, without mention of fluctuations: Secondary | ICD-10-CM

## 2017-07-12 DIAGNOSIS — Z872 Personal history of diseases of the skin and subcutaneous tissue: Secondary | ICD-10-CM

## 2017-07-12 NOTE — Progress Notes (Signed)
[facility]  Provider: Blanchie Serve MD   Location:  [place] of Service:  Clinic (12)  PCP: Blanchie Serve, MD Patient Care Team: Blanchie Serve, MD as PCP - General (Internal Medicine)  Extended Emergency Contact Information Primary Emergency Contact: Hester Mates Address: [address]          [place], [place] [address] Johnnette Litter of [place] Phone: [phone] Mobile Phone: [phone] Relation: Spouse Secondary Emergency Contact: She[place] of [place] Mobile Phone: [phone] Relation: Other  Code Status: DNR  Goals of Care: Advanced Directive information Advanced Directives 04/05/2017  Does Patient Have a Medical Advance Directive? Yes  Type of Paramedic of [place];Living will;Out of facility DNR (pink MOST or yellow form)  Does patient want to make changes to medical advance directive? Yes (MAU/Ambulatory/Procedural Areas - Information given)  Copy of [place] in Chart? Yes      Chief Complaint  Patient presents with  . Medical Management of Chronic Issues    3 month follow up. Patient's husband has some concerns about his wife going back to the urologist  . Medication Refill    No refills needed at this time.     HPI: Patient is a 84 y.o. female seen today for routine visit. She is here with her husband. Limited hpi and ROS from patient with her dementia and trouble with expressions of her thoughts. Her husband provides her HPI and ROS.   Parkinson's disease- takes carbidopa levodopa and is followed by neurology, wheelchair bound, po intake fair. No nausea or vomiting.  OAB- takes detrol 4 mg daily with UI. Also has history of prolapse that worsens her symptom  UTI prophylaxis- seen by urology and on trimethoprim daily, has UI  Protein calorie malnutrition- takes protein supplement, po intake good  Hypothyroidism- takes  levothyroxine, tolerating well  Dementia with behavior issue- on seroquel 25 mg qhs, mood stable, occasional episodes of crying and agitation reported  Past Medical History:  Diagnosis Date  . COPD (chronic obstructive pulmonary disease) ([place])   . Dysarthria   . Falls   . Gastroenteritis   . Hypothyroid   . Incontinence   . Parkinson disease ([place])    with dementia and hallucinations  . Rectal prolapse   . Recurrent UTI    Past Surgical History:  Procedure Laterality Date  . CATARACT EXTRACTION, BILATERAL    . CHOLECYSTECTOMY, LAPAROSCOPIC  2015   Dr. Dayton Martes   . FEMUR FRACTURE SURGERY    . REPAIR OF RECTAL PROLAPSE    . TONSILLECTOMY AND ADENOIDECTOMY      reports that she has never smoked. She has never used smokeless tobacco. She reports that she does not drink alcohol or use drugs. Social History   Socioeconomic History  . Marital status: Married    Spouse name: Not on file  . Number of children: Not on file  . Years of education: Not on file  . Highest education level: Not on file  Occupational History  . Not on file  Social Needs  . Financial resource strain: Not on file  . Food insecurity:    Worry: Not on file    Inability: Not on file  . Transportation needs:    Medical: Not on file    Non-medical: Not on file  Tobacco Use  . Smoking status: Never Smoker  . Smokeless tobacco: Never Used  Substance and Sexual Activity  .  Alcohol use: No  . Drug use: No  . Sexual activity: Not on file  Lifestyle  . Physical activity:    Days per week: Not on file    Minutes per session: Not on file  . Stress: Not on file  Relationships  . Social connections:    Talks on phone: Not on file    Gets together: Not on file    Attends religious service: Not on file    Active member of club or organization: Not on file    Attends meetings of clubs or organizations: Not on file    Relationship status: Not on file  . Intimate partner violence:    Fear of current or ex  partner: Not on file    Emotionally abused: Not on file    Physically abused: Not on file    Forced sexual activity: Not on file  Other Topics Concern  . Not on file  Social History Narrative   Moved to [facility] 05/27/2016   No tobacco use ever   . No alcohol use.    Does drinks/ eats things with caffeine.    Married in [place] lives in a 3 story apartment building with 2 other people and no pets.    Current or past profession; day care worker.    Does not exercise.    Has a living will, DNR and POA/HPOA.     Functional Status Survey:    Family History  Problem Relation Age of Onset  . Heart failure Mother 51  . Lymphoma Father 64  . Parkinson's disease Brother   . Brain cancer Brother 61    Health Maintenance  Topic Date Due  . Samul Dada  01/29/1952  . DEXA SCAN  01/30/2000  . PNA vac Low Risk Adult (1 of 2 - PCV13) 01/30/2000  . INFLUENZA VACCINE  10/19/2017    No Known Allergies  Outpatient Encounter Medications as of 07/12/2017  Medication Sig  . carbidopa-levodopa (SINEMET CR) 50-200 MG tablet Take 1 tablet by mouth at bedtime.  . carbidopa-levodopa (SINEMET IR) 25-100 MG tablet Take 1 tablet by mouth 3 (three) times daily.  Marland Kitchen ENSURE (ENSURE) Take 237 mLs by mouth. One daily with lunch  . levothyroxine (SYNTHROID, LEVOTHROID) 50 MCG tablet Take 1 tablet (50 mcg total) by mouth daily.  . QUEtiapine (SEROQUEL) 25 MG tablet Take 1 tablet (25 mg total) by mouth at bedtime.  . tolterodine (DETROL LA) 4 MG 24 hr capsule Take 4 mg by mouth daily.  Marland Kitchen trimethoprim (TRIMPEX) 100 MG tablet Take 100 mg by mouth daily.   No facility-administered encounter medications on file as of 07/12/2017.     Review of Systems  Constitutional: Negative for appetite change, chills, fever and unexpected weight change.  HENT: Positive for rhinorrhea. Negative for mouth sores.   Respiratory: Negative for cough, choking and shortness of breath.   Cardiovascular: Negative for chest  pain and leg swelling.  Gastrointestinal: Negative for abdominal pain, blood in stool, constipation, diarrhea, nausea and vomiting.       Moves her bowel every 2-3 days  Genitourinary: Negative for hematuria.       Has historyof recurrent UTI, no UTI in last 1 year, on chronic antibiotic for prophylaxis, maintains hydration, has ui  Musculoskeletal: Positive for gait problem. Negative for arthralgias and back pain.  Skin: Negative for rash and wound.       No drainage reported, uses wheelchair cushion  Neurological: Negative for dizziness and headaches.  Psychiatric/Behavioral: Positive for agitation and dysphoric mood. Negative for behavioral problems and sleep disturbance.    Vitals:   07/12/17 1059  BP: 118/70  Pulse: 80  Resp: 16  Temp: (!) 97.4 F (36.3 C)  TempSrc: Oral  SpO2: 91%  Weight: 93 lb 9.6 oz (42.5 kg)  Height: '5\' 4"'  (1.626 m)   Body mass index is 16.07 kg/m.   Wt Readings from Last 3 Encounters:  07/12/17 93 lb 9.6 oz (42.5 kg)  04/05/17 93 lb 9.6 oz (42.5 kg)  11/17/16 86 lb 9 oz (39.3 kg)   Physical Exam  Constitutional: No distress.  Thin built, frail, elderly female  HENT:  Head: Normocephalic and atraumatic.  Right Ear: External ear normal.  Left Ear: External ear normal.  Mouth/Throat: Oropharynx is clear and moist.  edentulous  Eyes: Pupils are equal, round, and reactive to light. EOM are normal. Right eye exhibits no discharge. Left eye exhibits no discharge.  Neck: Neck supple.  Cardiovascular: Normal rate and regular rhythm.  Murmur heard. Pulmonary/Chest: Effort normal and breath sounds normal. No respiratory distress. She has no wheezes. She has no rales.  Abdominal: Soft. Bowel sounds are normal. There is no tenderness.  Musculoskeletal: She exhibits deformity. She exhibits no edema or tenderness.  Has kyphosis and scoliosis, needs 1 person assistance with transfer, unsteady gait, non ambulatory  Lymphadenopathy:    She has no cervical  adenopathy.  Neurological: She is alert.  Oriented to person, rigidity to her upper and lower extremities, bradykinesia, mask like facies present, tremor present  Skin: Skin is warm and dry. No rash noted. She is not diaphoretic. No erythema. No pallor.  Dry and intact  Psychiatric:  Calm this visit, pleasantly confused    Labs reviewed: Basic Metabolic Panel: Recent Labs    04/13/17 0802 05/31/17 0000  NA 141 141  K 4.2 4.4  CL 105 107  CO2 28 27  GLUCOSE 79 87  BUN 32* 33*  CREATININE 0.95* 0.94*  CALCIUM 9.3 9.6   Liver Function Tests: Recent Labs    04/13/17 0802 05/31/17 0000  AST 13 15  ALT 3* <3*  BILITOT 0.5 0.6  PROT 6.9 6.7   No results for input(s): LIPASE, AMYLASE in the last 8760 hours. No results for input(s): AMMONIA in the last 8760 hours. CBC: Recent Labs    04/13/17 0802 05/31/17 0000  WBC 6.9 6.3  NEUTROABS 4,347 3,383  HGB 12.9 13.0  HCT 39.8 38.8  MCV 88.8 88.4  PLT 245 231   Cardiac Enzymes: No results for input(s): CKTOTAL, CKMB, CKMBINDEX, TROPONINI in the last 8760 hours. BNP: Invalid input(s): POCBNP Lab Results  Component Value Date   HGBA1C 6.2 12/22/2015   Lab Results  Component Value Date   TSH 0.22 (L) 05/31/2017   No results found for: VITAMINB12 No results found for: FOLATE No results found for: IRON, TIBC, FERRITIN  Lipid Panel: Recent Labs    04/13/17 0802 05/31/17 0000  CHOL 168 170  HDL 46* 43*  LDLCALC 102* 108*  TRIG 108 92  CHOLHDL 3.7 4.0   Lab Results  Component Value Date   HGBA1C 6.2 12/22/2015    Procedures since last visit: No results found.  Assessment/Plan  1. Acquired hypothyroidism Reviewed last TSH, medication dosing decreased on 06/05/17. Taking correct dosing. Recheck on TSH due next week.  - CMP with eGFR([facility]); Future - Lipid Panel; Future - CBC (no diff); Future - TSH; Future - Vitamin D, 25-hydroxy; Future  2.  Parkinson disease ([place]) Continue current sinemet dosing,  reviewed neurology note, no new changes - CMP with eGFR([facility]); Future - Lipid Panel; Future - CBC (no diff); Future - TSH; Future - Vitamin D, 25-hydroxy; Future  3. Dementia due to Parkinson's disease with behavioral disturbance ([place]) Supportive care, continue quetiapine  4. Recurrent UTI Continue trimethoprim, hydration and perineal care  5. Urinary incontinence, unspecified type Continue perineal care and detrol  6. Severe protein-calorie malnutrition ([place]) Continue protein supplement and po feed. Weight low but stable. Decline anticipated with her parkinson's and dementia - Vitamin D, 25-hydroxy; Future  7. History of pressure ulcer Intact skin, continue pressure ulcer prophylaxis  8. Unsteady gait Wheelchair and assistance with transfer and ambulation - Vitamin D, 25-hydroxy; Future  9. Postural kyphosis of cervicothoracic region Denies pain, supportive care - Vitamin D, 25-hydroxy; Future    Labs/tests ordered:   Lab Orders     CMP with eGFR([facility])     Lipid Panel     CBC (no diff)     TSH     Vitamin D, 25-hydroxy   Next appointment: 6 months  Communication: reviewed care plan with patient and her husband    Blanchie Serve, MD Internal Medicine [address] [place], [place] [address] Cell Phone (Monday-Friday 8 am - 5 pm): [phone] On Call: [phone] and follow prompts after 5 pm and on weekends Office Phone: [phone] Office Fax: [phone]

## 2017-07-24 DIAGNOSIS — E039 Hypothyroidism, unspecified: Secondary | ICD-10-CM | POA: Diagnosis not present

## 2017-07-24 LAB — TSH: TSH: 11.67 mIU/L — ABNORMAL HIGH (ref 0.40–4.50)

## 2017-07-24 LAB — T4, FREE: Free T4: 1 ng/dL (ref 0.8–1.8)

## 2017-07-28 ENCOUNTER — Other Ambulatory Visit: Payer: Self-pay

## 2017-07-28 DIAGNOSIS — G20 Parkinson's disease: Secondary | ICD-10-CM

## 2017-07-28 DIAGNOSIS — E039 Hypothyroidism, unspecified: Secondary | ICD-10-CM

## 2017-08-07 ENCOUNTER — Telehealth: Payer: Self-pay | Admitting: Neurology

## 2017-08-07 MED ORDER — CARBIDOPA-LEVODOPA ER 50-200 MG PO TBCR: 1.0000 | EXTENDED_RELEASE_TABLET | Freq: Every day | ORAL | 1 refills | Status: DC

## 2017-08-07 NOTE — Telephone Encounter (Signed)
RX sent

## 2017-08-07 NOTE — Telephone Encounter (Signed)
If patient is at the pharmacy, call can be transferred to refill team.  1.     Which medications need to be refilled? (please list name of each medication and dose if know) Carbidopa Levodopa 50- 200  2.     Which pharmacy/location (including street and city if local pharmacy) is medication to be sent to? [facility]  3.     Do they need a 30 or 90 day supply? Patient's husband requested 3 refills on medication.  Thanks

## 2017-08-24 DIAGNOSIS — G20 Parkinson's disease: Secondary | ICD-10-CM

## 2017-08-24 DIAGNOSIS — E039 Hypothyroidism, unspecified: Secondary | ICD-10-CM

## 2017-08-24 LAB — TSH: TSH: 22.17 m[IU]/L — AB (ref 0.40–4.50)

## 2017-08-24 LAB — T4, FREE: Free T4: 1.1 ng/dL (ref 0.8–1.8)

## 2017-08-28 ENCOUNTER — Other Ambulatory Visit: Payer: Self-pay

## 2017-08-28 DIAGNOSIS — G20 Parkinson's disease: Secondary | ICD-10-CM

## 2017-08-28 DIAGNOSIS — E039 Hypothyroidism, unspecified: Secondary | ICD-10-CM

## 2017-08-28 MED ORDER — LEVOTHYROXINE SODIUM 75 MCG PO TABS: 75.0000 ug | ORAL_TABLET | Freq: Every day | ORAL | 3 refills | Status: DC

## 2017-08-31 ENCOUNTER — Other Ambulatory Visit: Payer: Self-pay | Admitting: Neurology

## 2017-08-31 MED ORDER — CARBIDOPA-LEVODOPA 25-100 MG PO TABS: 1.0000 | ORAL_TABLET | Freq: Three times a day (TID) | ORAL | 1 refills | Status: DC

## 2017-09-13 DIAGNOSIS — H524 Presbyopia: Secondary | ICD-10-CM | POA: Diagnosis not present

## 2017-09-13 DIAGNOSIS — H35363 Drusen (degenerative) of macula, bilateral: Secondary | ICD-10-CM | POA: Diagnosis not present

## 2017-09-18 ENCOUNTER — Telehealth: Payer: Self-pay | Admitting: Neurology

## 2017-09-18 MED ORDER — CARBIDOPA-LEVODOPA 25-100 MG PO TABS: 1.0000 | ORAL_TABLET | Freq: Three times a day (TID) | ORAL | 1 refills | Status: DC

## 2017-09-18 NOTE — Telephone Encounter (Signed)
RX sent to pharmacy as requested °

## 2017-09-18 NOTE — Telephone Encounter (Signed)
Patient wants us to call in the carbidopa levodopa 25-100 mg  to the [facility] on [address]. A 90 supply he only has about 5 days worth left for his wife

## 2017-09-20 ENCOUNTER — Other Ambulatory Visit: Payer: Self-pay

## 2017-09-20 DIAGNOSIS — E039 Hypothyroidism, unspecified: Secondary | ICD-10-CM

## 2017-09-20 DIAGNOSIS — G20 Parkinson's disease: Secondary | ICD-10-CM

## 2017-09-25 DIAGNOSIS — E039 Hypothyroidism, unspecified: Secondary | ICD-10-CM | POA: Diagnosis not present

## 2017-09-25 DIAGNOSIS — G20 Parkinson's disease: Secondary | ICD-10-CM | POA: Diagnosis not present

## 2017-09-25 LAB — T4, FREE: Free T4: 1.4 ng/dL (ref 0.8–1.8)

## 2017-09-25 LAB — TSH: TSH: 1.69 mIU/L (ref 0.40–4.50)

## 2017-09-29 DIAGNOSIS — M79672 Pain in left foot: Secondary | ICD-10-CM | POA: Diagnosis not present

## 2017-09-29 DIAGNOSIS — B351 Tinea unguium: Secondary | ICD-10-CM | POA: Diagnosis not present

## 2017-09-29 DIAGNOSIS — M79671 Pain in right foot: Secondary | ICD-10-CM | POA: Diagnosis not present

## 2017-09-29 DIAGNOSIS — L84 Corns and callosities: Secondary | ICD-10-CM | POA: Diagnosis not present

## 2017-10-04 DIAGNOSIS — N390 Urinary tract infection, site not specified: Secondary | ICD-10-CM | POA: Diagnosis not present

## 2017-10-04 DIAGNOSIS — N3946 Mixed incontinence: Secondary | ICD-10-CM | POA: Diagnosis not present

## 2017-11-01 ENCOUNTER — Encounter: Payer: Self-pay | Admitting: Internal Medicine

## 2017-11-27 DIAGNOSIS — E039 Hypothyroidism, unspecified: Secondary | ICD-10-CM | POA: Diagnosis not present

## 2017-11-27 DIAGNOSIS — M4003 Postural kyphosis, cervicothoracic region: Secondary | ICD-10-CM

## 2017-11-27 DIAGNOSIS — R2681 Unsteadiness on feet: Secondary | ICD-10-CM | POA: Diagnosis not present

## 2017-11-27 DIAGNOSIS — E43 Unspecified severe protein-calorie malnutrition: Secondary | ICD-10-CM

## 2017-11-27 DIAGNOSIS — G20 Parkinson's disease: Secondary | ICD-10-CM | POA: Diagnosis not present

## 2017-11-28 LAB — CBC
HCT: 41.4 % (ref 35.0–45.0)
Hemoglobin: 13.9 g/dL (ref 11.7–15.5)
MCH: 29.9 pg (ref 27.0–33.0)
MCHC: 33.6 g/dL (ref 32.0–36.0)
MCV: 89 fL (ref 80.0–100.0)
MPV: 11.1 fL (ref 7.5–12.5)
PLATELETS: 272 10*3/uL (ref 140–400)
RBC: 4.65 10*6/uL (ref 3.80–5.10)
RDW: 12.3 % (ref 11.0–15.0)
WBC: 7.9 10*3/uL (ref 3.8–10.8)

## 2017-11-28 LAB — COMPLETE METABOLIC PANEL WITH GFR
AG RATIO: 1.4 (calc) (ref 1.0–2.5)
ALBUMIN MSPROF: 4 g/dL (ref 3.6–5.1)
ALKALINE PHOSPHATASE (APISO): 51 U/L (ref 33–130)
ALT: 3 U/L — ABNORMAL LOW (ref 6–29)
AST: 15 U/L (ref 10–35)
BILIRUBIN TOTAL: 0.4 mg/dL (ref 0.2–1.2)
BUN / CREAT RATIO: 28 (calc) — AB (ref 6–22)
BUN: 33 mg/dL — ABNORMAL HIGH (ref 7–25)
CHLORIDE: 103 mmol/L (ref 98–110)
CO2: 27 mmol/L (ref 20–32)
Calcium: 9.4 mg/dL (ref 8.6–10.4)
Creat: 1.18 mg/dL — ABNORMAL HIGH (ref 0.60–0.88)
GFR, Est African American: 50 mL/min/{1.73_m2} — ABNORMAL LOW (ref >=60)
GFR, Est Non African American: 43 mL/min/{1.73_m2} — ABNORMAL LOW (ref >=60)
GLOBULIN: 2.8 g/dL (ref 1.9–3.7)
Glucose, Bld: 73 mg/dL (ref 65–99)
Potassium: 4.5 mmol/L (ref 3.5–5.3)
SODIUM: 139 mmol/L (ref 135–146)
Total Protein: 6.8 g/dL (ref 6.1–8.1)

## 2017-11-28 LAB — LIPID PANEL
CHOLESTEROL: 177 mg/dL (ref <200)
HDL: 46 mg/dL — AB (ref >50)
LDL CHOLESTEROL (CALC): 111 mg/dL — AB
Non-HDL Cholesterol (Calc): 131 mg/dL (calc) — ABNORMAL HIGH (ref <130)
TRIGLYCERIDES: 95 mg/dL (ref <150)
Total CHOL/HDL Ratio: 3.8 (calc) (ref <5.0)

## 2017-11-28 LAB — VITAMIN D 25 HYDROXY (VIT D DEFICIENCY, FRACTURES): VIT D 25 HYDROXY: 22 ng/mL — AB (ref 30–100)

## 2017-12-06 ENCOUNTER — Non-Acute Institutional Stay: Payer: Medicare Other | Admitting: Internal Medicine

## 2017-12-06 ENCOUNTER — Other Ambulatory Visit: Payer: Self-pay

## 2017-12-06 ENCOUNTER — Encounter: Payer: Self-pay | Admitting: Internal Medicine

## 2017-12-06 VITALS — BP 116/68 | HR 73 | Temp 97.9°F | Ht 64.0 in | Wt 92.0 lb

## 2017-12-06 DIAGNOSIS — F0281 Dementia in other diseases classified elsewhere with behavioral disturbance: Secondary | ICD-10-CM

## 2017-12-06 DIAGNOSIS — G20 Parkinson's disease: Secondary | ICD-10-CM | POA: Diagnosis not present

## 2017-12-06 DIAGNOSIS — R05 Cough: Secondary | ICD-10-CM

## 2017-12-06 DIAGNOSIS — R059 Cough, unspecified: Secondary | ICD-10-CM

## 2017-12-06 DIAGNOSIS — E559 Vitamin D deficiency, unspecified: Secondary | ICD-10-CM

## 2017-12-06 DIAGNOSIS — E039 Hypothyroidism, unspecified: Secondary | ICD-10-CM

## 2017-12-06 DIAGNOSIS — N183 Chronic kidney disease, stage 3 unspecified: Secondary | ICD-10-CM | POA: Insufficient documentation

## 2017-12-06 DIAGNOSIS — F02818 Dementia in other diseases classified elsewhere, unspecified severity, with other behavioral disturbance: Secondary | ICD-10-CM

## 2017-12-06 DIAGNOSIS — E43 Unspecified severe protein-calorie malnutrition: Secondary | ICD-10-CM

## 2017-12-06 MED ORDER — VITAMIN D (ERGOCALCIFEROL) 1.25 MG (50000 UNIT) PO CAPS: 50000.0000 [IU] | ORAL_CAPSULE | ORAL | 3 refills | Status: DC

## 2017-12-06 NOTE — Progress Notes (Signed)
[facility]  Provider: Blanchie Serve MD   Location:  [place] of Service:  Clinic (12)  PCP: Blanchie Serve, MD Patient Care Team: Blanchie Serve, MD as PCP - General (Internal Medicine)  Extended Emergency Contact Information Primary Emergency Contact: Hester Mates Address: [address]          [place], [place] [address] Johnnette Litter of [place] Phone: [phone] Mobile Phone: [phone] Relation: Spouse Secondary Emergency Contact: [place] of [place] Mobile Phone: [phone] Relation: Other   Goals of Care: Advanced Directive information Advanced Directives 12/06/2017  Does Patient Have a Medical Advance Directive? Yes  Type of Paramedic of [place];Living will  Does patient want to make changes to medical advance directive? -  Copy of [place] in Chart? No - copy requested     Chief Complaint  Patient presents with  . Medical Management of Chronic Issues    Resident is being seen for a 5 month follow up.     HPI: Patient is a 84 y.o. female seen today for routine visit. She is here with her husband. She has advanced dementia with parkinson's disease and does not participate in HPI or ROS. She resides in ILF and her husband is her primary care giver. She requires assistance with transfer. She is wheelchair bound and has to be wheeled around,   Past Medical History:  Diagnosis Date  . COPD (chronic obstructive pulmonary disease) ([place])   . Dysarthria   . Falls   . Gastroenteritis   . Hypothyroid   . Incontinence   . Parkinson disease ([place])    with dementia and hallucinations  . Rectal prolapse   . Recurrent UTI    Past Surgical History:  Procedure Laterality Date  . CATARACT EXTRACTION, BILATERAL    . CHOLECYSTECTOMY, LAPAROSCOPIC  2015   Dr. Dayton Martes   . FEMUR FRACTURE SURGERY    . REPAIR OF RECTAL PROLAPSE    . TONSILLECTOMY  AND ADENOIDECTOMY      reports that she has never smoked. She has never used smokeless tobacco. She reports that she does not drink alcohol or use drugs. Social History   Socioeconomic History  . Marital status: Married    Spouse name: Not on file  . Number of children: Not on file  . Years of education: Not on file  . Highest education level: Not on file  Occupational History  . Not on file  Social Needs  . Financial resource strain: Not on file  . Food insecurity:    Worry: Not on file    Inability: Not on file  . Transportation needs:    Medical: Not on file    Non-medical: Not on file  Tobacco Use  . Smoking status: Never Smoker  . Smokeless tobacco: Never Used  Substance and Sexual Activity  . Alcohol use: No  . Drug use: No  . Sexual activity: Not on file  Lifestyle  . Physical activity:    Days per week: Not on file    Minutes per session: Not on file  . Stress: Not on file  Relationships  . Social connections:    Talks on phone: Not on file    Gets together: Not on file    Attends religious service: Not on file    Active member of club or organization: Not on file    Attends meetings of clubs or organizations:  Not on file    Relationship status: Not on file  . Intimate partner violence:    Fear of current or ex partner: Not on file    Emotionally abused: Not on file    Physically abused: Not on file    Forced sexual activity: Not on file  Other Topics Concern  . Not on file  Social History Narrative   Moved to [facility] 05/27/2016   No tobacco use ever   . No alcohol use.    Does drinks/ eats things with caffeine.    Married in [place] lives in a 3 story apartment building with 2 other people and no pets.    Current or past profession; day care worker.    Does not exercise.    Has a living will, DNR and POA/HPOA.     Functional Status Survey:    Family History  Problem Relation Age of Onset  . Heart failure Mother 29  . Lymphoma Father 78    . Parkinson's disease Brother   . Brain cancer Brother 76    Health Maintenance  Topic Date Due  . DEXA SCAN  01/30/2000  . INFLUENZA VACCINE  01/18/2018 (Originally 10/19/2017)  . PNA vac Low Risk Adult (1 of 2 - PCV13) 01/18/2018 (Originally 01/30/2000)  . TETANUS/TDAP  12/07/2018 (Originally 01/29/1952)    No Known Allergies  Outpatient Encounter Medications as of 12/06/2017  Medication Sig  . carbidopa-levodopa (SINEMET CR) 50-200 MG tablet Take 1 tablet by mouth at bedtime.  . carbidopa-levodopa (SINEMET IR) 25-100 MG tablet Take 1 tablet by mouth 3 (three) times daily.  Marland Kitchen ENSURE (ENSURE) Take 237 mLs by mouth. One daily with lunch  . levothyroxine (SYNTHROID, LEVOTHROID) 75 MCG tablet Take 1 tablet (75 mcg total) by mouth daily.  . QUEtiapine (SEROQUEL) 25 MG tablet Take 1 tablet (25 mg total) by mouth at bedtime.  Marland Kitchen trimethoprim (TRIMPEX) 100 MG tablet Take 100 mg by mouth daily.  . [DISCONTINUED] tolterodine (DETROL LA) 4 MG 24 hr capsule Take 4 mg by mouth daily.  . Vitamin D, Ergocalciferol, (DRISDOL) 50000 units CAPS capsule Take 1 capsule (50,000 Units total) by mouth every 7 (seven) days.   No facility-administered encounter medications on file as of 12/06/2017.     Review of Systems  Unable to perform ROS: Dementia (obtained from husband)  Constitutional: Positive for fatigue. Negative for appetite change and fever.  HENT: Positive for rhinorrhea. Negative for trouble swallowing.   Respiratory: Positive for cough. Negative for shortness of breath.        Wet quality cough for 5 days. Edentulous. Husband gives her foot cut in small pieces but of regular consistency. He has noticed some coughing during meals.   Gastrointestinal: Negative for diarrhea and vomiting.       Moves her bowel every 2 days  Genitourinary:       Incontinent with bowel and bladder  Musculoskeletal: Positive for gait problem.  Neurological: Negative for syncope and headaches.   Psychiatric/Behavioral: Positive for confusion. Negative for agitation.    Vitals:   12/06/17 1059  BP: 116/68  Pulse: 73  Temp: 97.9 F (36.6 C)  TempSrc: Oral  SpO2: 96%  Weight: 92 lb (41.7 kg)  Height: _0  (1.626 m)   Body mass index is 15.79 kg/m.   Wt Readings from Last 3 Encounters:  12/06/17 92 lb (41.7 kg)  07/12/17 93 lb 9.6 oz (42.5 kg)  04/05/17 93 lb 9.6 oz (42.5 kg)  Physical Exam  Constitutional: No distress.  Thin built, frail, elderly female  HENT:  Head: Normocephalic and atraumatic.  Edentulous, moist mucus membrane. Clear nasal discharge  Eyes: Conjunctivae are normal. Right eye exhibits no discharge. Left eye exhibits no discharge.  Has corrective glasses  Neck: Neck supple.  Cardiovascular: Normal rate and regular rhythm.  Pulmonary/Chest: Effort normal. She has no wheezes. She has no rales.  Poor air movement  Abdominal: Soft. Bowel sounds are normal. There is no tenderness.  Musculoskeletal: She exhibits no edema.  Not following instructions, needs assistance with transfer in and out of bed, has to be wheeled in her wheelchair  Lymphadenopathy:    She has no cervical adenopathy.  Neurological:  Mask like facial expression, rigidity to her upper extremity, no tremors noted  Skin: Skin is warm and dry. She is not diaphoretic.    Labs reviewed: Basic Metabolic Panel: Recent Labs    04/13/17 0802 05/31/17 0000 11/27/17 1000  NA 141 141 139  K 4.2 4.4 4.5  CL 105 107 103  CO2 _0 GLUCOSE 79 87 73  BUN 32* 33* 33*  CREATININE 0.95* 0.94* 1.18*  CALCIUM 9.3 9.6 9.4   Liver Function Tests: Recent Labs    04/13/17 0802 05/31/17 0000 11/27/17 1000  AST _1 ALT 3* <3* 3*  BILITOT 0.5 0.6 0.4  PROT 6.9 6.7 6.8   No results for input(s): LIPASE, AMYLASE in the last 8760 hours. No results for input(s): AMMONIA in the last 8760 hours. CBC: Recent Labs    04/13/17 0802 05/31/17 0000 11/27/17 1000  WBC 6.9 6.3 7.9   NEUTROABS 4,347 3,383  --   HGB 12.9 13.0 13.9  HCT 39.8 38.8 41.4  MCV 88.8 88.4 89.0  PLT 245 231 272   Cardiac Enzymes: No results for input(s): CKTOTAL, CKMB, CKMBINDEX, TROPONINI in the last 8760 hours. BNP: Invalid input(s): POCBNP Lab Results  Component Value Date   HGBA1C 6.2 12/22/2015   Lab Results  Component Value Date   TSH 1.69 09/25/2017   No results found for: VITAMINB12 No results found for: FOLATE No results found for: IRON, TIBC, FERRITIN  Lipid Panel: Recent Labs    04/13/17 0802 05/31/17 0000 11/27/17 1000  CHOL 168 170 177  HDL 46* 43* 46*  LDLCALC 102* 108* 111*  TRIG 108 92 95  CHOLHDL 3.7 4.0 3.8   Lab Results  Component Value Date   HGBA1C 6.2 12/22/2015    Procedures since last visit: No results found.  Assessment/Plan  1. Vitamin D deficiency Low vit d level. Start weekly vitamin d supplement for 12 weeks followed by recheck of vitamin d level.  - Vitamin D, Ergocalciferol, (DRISDOL) 50000 units CAPS capsule; Take 1 capsule (50,000 Units total) by mouth every 7 (seven) days.  Dispense: 5 capsule; Refill: 3  2. Cough High aspiratio risk, SLP to evaluate for need for change in consistency of diet and safe swallowing technique - Ambulatory referral to Speech Therapy  3. Parkinson disease ([place]) Continue sinemet  4. Dementia due to Parkinson's disease with behavioral disturbance ([place]) Continue sinement, supportive care, fall precautions, skin care, husband sole care giver. SLP consult with advanced dementia and high aspiration risk.  - Ambulatory referral to Speech Therapy  5. Acquired hypothyroidism Reviewed last TSH, stable, continue current regimen of levothyroxine  6. Severe protein-calorie malnutrition ([place]) Decline anticipated with her advanced dementia, supportive care, SLP consult.   7. CKD (chronic kidney disease) stage 3,  GFR 30-59 ml/min (HCC) Encouraged to maintain hydration, avoid NSAIDs, monitor bmp periodically.      Labs/tests ordered:  TSH, CMP with eGFR, SLP consult   Next appointment: 3 months with Dinah  Communication: reviewed care plan with patient and her husband    Blanchie Serve, MD Internal Medicine [address] [place], [place] [address] Cell Phone (Monday-Friday 8 am - 5 pm): [phone] On Call: [phone] and follow prompts after 5 pm and on weekends Office Phone: [phone] Office Fax: [phone]

## 2017-12-15 ENCOUNTER — Telehealth: Payer: Self-pay

## 2017-12-15 NOTE — Telephone Encounter (Signed)
Lisa Henry with [facility] called to inquire about more information to process referral received on patient.   Lisa Henry would like to know exactly what services Dr.Pandey would like for them to provide. I asked Lisa Henry if she received OV notes. Per Lisa Henry the only thing receive is the referral.   Lisa Henry states a list of services that they provide is available on their web site.   I advised Lisa Henry that I will refax referral will all information needed to process and if additional information is still needed to have referral coordinator call us back. Per [employer] referral coordinator is Lisa Henry and she can be reached at [phone]   Referral and pertinent information faxed to: [phone]  S.Chrae B/CMA

## 2017-12-18 NOTE — Telephone Encounter (Signed)
I called Lisa Henry with [facility], left message requesting that she VOID any information sent on patient. Patient is receiving speech therapy through the facility in which she lives.

## 2017-12-18 NOTE — Telephone Encounter (Signed)
Lisa Henry with [facility] called to say that they can't provide the service requested in the referral. She also stated that they recommend [facility] outpatient for this speech referral. If there are any questions please refer to [facility] she can be reached at [phone].

## 2017-12-18 NOTE — Telephone Encounter (Signed)
Jasmine December, I want Ms Chesley to be seen by [facility] due to high aspiration risk for swallow evaluation. I had provided a script with SLP order to patient during the office visit. Talk with [place] nurse to co-ordinate this. I had put in a referral in Epic for documentation purpose.

## 2017-12-27 ENCOUNTER — Telehealth: Payer: Self-pay | Admitting: Internal Medicine

## 2017-12-27 NOTE — Telephone Encounter (Signed)
I left a message asking the patient to call me at [phone] to schedule AWV with Sara at [facility] on afternoon of 01/03/18 if available. VDM (DD) °

## 2018-01-01 NOTE — Progress Notes (Signed)
Lisa Henry was seen today in the movement disorders clinic for neurologic consultation at the request of Blanchie Serve, MD.  The consultation is for the evaluation of PD.  Dx was apparently made on Oct 1,1995.  First sx was R leg tremor.  Husband states that carbidopa/levodopa and requip were started at same time and she did well with these meds.  Husband unsure of why requip was taken off (presumably memory change but he states it was years ago).  She is on carbidopa/levodopa 25/100, qid (mealtime and bedtime).  She is also on selegeline, 5 mg, 2 po q am.    No prior neuro records are available.    11/17/16 update:  Patient seen today in follow-up for her Parkinson's disease.  She is accompanied by her husband and daughter who supplements the history.  The patient is on carbidopa/levodopa 25/100, one tablet 3 times per day.  I changed her nighttime levodopa to carbidopa/levodopa 50/200.  I did this in hopes that it would help her 3 AM tremor.  Her husband states that this is still there.  She is still on selegiline 5 mg and takes 2 tablets in the morning.  In regards to hallucinations, she still has these.  She doesn't know that they aren't real.  More fearful of these.   She has illusions as well.  She has had no lightheadedness or near syncope.  No falls since our last visit.  No new medical issues have arisen.  She has some nightmares at night.    02/21/17 update: Patient seen today in follow-up for her Parkinson's disease with Parkinson's dementia.  She is accompanied by her husband and daughter who supplement the history.  She is on carbidopa/levodopa 25/100, 1 tablet 3 times per day and carbidopa/levodopa 50/200 at night.  We discontinued her selegiline last visit.  We added quetiapine.  She is on 25 mg at night.  She is less agitated, but still has hallucinations.  Her family did not want to add daytime quetiapine.  Sleeping is better.  Hallucinations are there most of the time but she isn't  agitated by them and haven't been disturbing to patient or family.  Some illusions as well.  Husband has very little respite care.  On Thursdays, he has someone for 2 hours/day 1 week and 3 hours the next day.  His daughter is concerned that that is not enough.  They also are not well equipped within the home, although they do live within senior living.  They do live independently.  07/04/17 update: Patient is seen today in follow-up for Parkinson's disease with associated Parkinson's disease dementia.  She is accompanied by her husband who supplements the history.  Patient remains on carbidopa/levodopa 25/100, 1 tablet 3 times per day as well as carbidopa/levodopa 50/200 at bedtime.  The patient does have hallucinations and associated agitation, so she is on quetiapine 25 mg at night and I told the patient's husband/daughter that they could use half a tablet during the day.  Today, they state that she isn't getting any in the day.  Mood has been "about the same."  Hallucinations are present but better than in the past.  They have met independently with my social worker since last visit.  Records are reviewed from that visit.  Patient was referred for home health services, but refused that and then when they met with my social worker asked for those services again.  When the home health therapist got to the home, services  were refused again.  Husband states that she likes to get on the floor and crawl on the floor.  No falls but generally doesn't walk without her husband.    01/03/18 update: Patient is seen today in follow-up for Parkinson's disease/Parkinson's disease dementia.  She is accompanied by her husband who supplements history.  Patient is on carbidopa/levodopa 25/100, 1 tablet 3 times per day and carbidopa/levodopa 50/200 at bedtime.  She is also on quetiapine 25 mg at bedtime.  She sleeps pretty well per husband.  She awakens at night but doesn't get up. she does doze in the daytime.  She continues to  have some agitation and some hallucinations, but hallucinations are much improved.  Records are reviewed since our last visit.  Her primary care physician put in speech therapy orders because of aspiration risk, and that was completed where she lives at a friend's home [place].  She is eating well per husband.   She is apraxic and when transferring he cannot get her to let go of WC.  PREVIOUS MEDICATIONS: Sinemet, Requip, Eldpryl and nuplazid  ALLERGIES:  No Known Allergies  CURRENT MEDICATIONS:  Outpatient Encounter Medications as of 01/03/2018  Medication Sig  . carbidopa-levodopa (SINEMET CR) 50-200 MG tablet Take 1 tablet by mouth at bedtime.  . carbidopa-levodopa (SINEMET IR) 25-100 MG tablet Take 1 tablet by mouth 3 (three) times daily.  Marland Kitchen ENSURE (ENSURE) Take 237 mLs by mouth. One daily with lunch  . levothyroxine (SYNTHROID, LEVOTHROID) 75 MCG tablet Take 1 tablet (75 mcg total) by mouth daily.  . QUEtiapine (SEROQUEL) 25 MG tablet Take 1 tablet (25 mg total) by mouth at bedtime.  Marland Kitchen trimethoprim (TRIMPEX) 100 MG tablet Take 100 mg by mouth daily.  . Vitamin D, Ergocalciferol, (DRISDOL) 50000 units CAPS capsule Take 1 capsule (50,000 Units total) by mouth every 7 (seven) days.   No facility-administered encounter medications on file as of 01/03/2018.     PAST MEDICAL HISTORY:   Past Medical History:  Diagnosis Date  . COPD (chronic obstructive pulmonary disease) ([place])   . Dysarthria   . Falls   . Gastroenteritis   . Hypothyroid   . Incontinence   . Parkinson disease ([place])    with dementia and hallucinations  . Rectal prolapse   . Recurrent UTI     PAST SURGICAL HISTORY:   Past Surgical History:  Procedure Laterality Date  . CATARACT EXTRACTION, BILATERAL    . CHOLECYSTECTOMY, LAPAROSCOPIC  2015   Dr. Dayton Martes   . FEMUR FRACTURE SURGERY    . REPAIR OF RECTAL PROLAPSE    . TONSILLECTOMY AND ADENOIDECTOMY      SOCIAL HISTORY:   Social History   Socioeconomic History    . Marital status: Married    Spouse name: Not on file  . Number of children: Not on file  . Years of education: Not on file  . Highest education level: Not on file  Occupational History  . Not on file  Social Needs  . Financial resource strain: Not on file  . Food insecurity:    Worry: Not on file    Inability: Not on file  . Transportation needs:    Medical: Not on file    Non-medical: Not on file  Tobacco Use  . Smoking status: Never Smoker  . Smokeless tobacco: Never Used  Substance and Sexual Activity  . Alcohol use: No  . Drug use: No  . Sexual activity: Not on file  Lifestyle  . Physical  activity:    Days per week: Not on file    Minutes per session: Not on file  . Stress: Not on file  Relationships  . Social connections:    Talks on phone: Not on file    Gets together: Not on file    Attends religious service: Not on file    Active member of club or organization: Not on file    Attends meetings of clubs or organizations: Not on file    Relationship status: Not on file  . Intimate partner violence:    Fear of current or ex partner: Not on file    Emotionally abused: Not on file    Physically abused: Not on file    Forced sexual activity: Not on file  Other Topics Concern  . Not on file  Social History Narrative   Moved to [facility] 05/27/2016   No tobacco use ever   . No alcohol use.    Does drinks/ eats things with caffeine.    Married in [place] lives in a 3 story apartment building with 2 other people and no pets.    Current or past profession; day care worker.    Does not exercise.    Has a living will, DNR and POA/HPOA.     FAMILY HISTORY:   Family Status  Relation Name Status  . Mother Davy Pique Deceased  . Father Lance Bosch Deceased  . Brother [employer]  . Daughter [employer]  . Son [employer]  . Brother Lynann Bologna Deceased  . Brother Roger Deceased  . Brother C. [employer]  . Daughter Manus Gunning  . Son Chrissie Noa  Alive    ROS:  Review of Systems  Unable to perform ROS: Dementia     PHYSICAL EXAMINATION:    VITALS:   Vitals:   01/03/18 1115  BP: 124/60  Pulse: 74    GEN:  The patient appears stated age and is in NAD. HEENT:  Normocephalic, atraumatic.  The mucous membranes are very dry. The superficial temporal arteries are without ropiness or tenderness. CV:  RRR Lungs:  CTAB Neck/HEME:  There are no carotid bruits bilaterally.  The neck is flexed.  Neurological examination:  Orientation: The patient is alert but is unable to participate with questions of orientation.  Cranial nerves: There is good facial symmetry.  She tracks appropriately about the room, but does not participate with extraocular muscle testing.  Her speech is somewhat dysarthric and lacks spontaneity.  Hearing appears to be intact to conversational tone.   Sensation: Sensation is intact to light touch.  Does not participate with sensitive aspects of the sensory testing.  (Same as prior) Motor: Strength is at least antigravity 4.  Does not participate with manual muscle testing.  (Same as prior)   Movement examination: Tone: There is normal tone in the upper and lower extremities today. Abnormal movements: There is no rest tremor today Coordination:  There is difficulty assessing coordination for decremation, primarily due to apraxia.  She is able to do hand opening and closing and finger taps bilaterally (same as previous). Gait and Station: Her husband grabs her hands and pulls her out of the wheelchair.  She really does not walk well because of scissored gait and trips herself over the feet.  Labs:  Lab Results  Component Value Date   TSH 1.69 09/25/2017     Chemistry      Component Value Date/Time   NA 139 11/27/2017  1000   NA 140 12/22/2015   K 4.5 11/27/2017 1000   CL 103 11/27/2017 1000   CO2 27 11/27/2017 1000   BUN 33 (H) 11/27/2017 1000   BUN 25 (A) 12/22/2015   CREATININE 1.18 (H) 11/27/2017  1000   GLU 108 12/22/2015      Component Value Date/Time   CALCIUM 9.4 11/27/2017 1000   ALKPHOS 66 12/22/2015   AST 15 11/27/2017 1000   ALT 3 (L) 11/27/2017 1000   BILITOT 0.4 11/27/2017 1000       ASSESSMENT/PLAN:  1.  Idiopathic Parkinson's disease, advance.  The patient has tremor, bradykinesia, rigidity and mild postural instability.  -We discussed the diagnosis as well as pathophysiology of the disease.  We discussed treatment options as well as prognostic indicators.  Patient education was provided.  -continue carbidopa/levodopa 25/100 1 po tid  -continue carbidopa/levodopa 50/200 q hs  -There are no using [facility] solutions 3 days/week and that is working well.   2.  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    3.  Follow-up 1 year.     Cc:  Blanchie Serve, MD

## 2018-01-03 ENCOUNTER — Non-Acute Institutional Stay: Payer: Medicare Other

## 2018-01-03 ENCOUNTER — Ambulatory Visit: Payer: Medicare Other | Admitting: Neurology

## 2018-01-03 ENCOUNTER — Encounter: Payer: Self-pay | Admitting: Neurology

## 2018-01-03 VITALS — BP 124/60 | HR 74

## 2018-01-03 VITALS — BP 110/64 | Temp 97.5°F | Ht 64.0 in

## 2018-01-03 DIAGNOSIS — F0281 Dementia in other diseases classified elsewhere with behavioral disturbance: Secondary | ICD-10-CM | POA: Diagnosis not present

## 2018-01-03 DIAGNOSIS — Z0000 Encounter for general adult medical examination without abnormal findings: Secondary | ICD-10-CM

## 2018-01-03 DIAGNOSIS — G20 Parkinson's disease: Secondary | ICD-10-CM | POA: Diagnosis not present

## 2018-01-03 MED ORDER — PNEUMOCOCCAL 13-VAL CONJ VACC IM SUSP: 0.5000 mL | INTRAMUSCULAR | 1 refills | Status: AC

## 2018-01-03 NOTE — Progress Notes (Signed)
Subjective:   Lisa Henry is a 84 y.o. female who presents for an Initial Medicare Annual Wellness Visit at [facility]       Objective:    Today's Vitals   01/03/18 1554  BP: 110/64  Temp: (!) 97.5 F (36.4 C)  TempSrc: Oral  Height: 5\' 4"  (1.626 m)   Body mass index is 15.79 kg/m.  Advanced Directives 01/03/2018 12/06/2017 04/05/2017 07/12/2016  Does Patient Have a Medical Advance Directive? Yes Yes Yes Yes  Type of Estate agent of [place];Living will Healthcare Power of [place];Living will Healthcare Power of [place];Living will;Out of facility DNR (pink MOST or yellow form) Healthcare Power of [place];Living will  Does patient want to make changes to medical advance directive? - - Yes (MAU/Ambulatory/Procedural Areas - Information given) -  Copy of Healthcare Power of Attorney in Chart? No - copy requested No - copy requested Yes No - copy requested    Current Medications (verified) Outpatient Encounter Medications as of 01/03/2018  Medication Sig  . carbidopa-levodopa (SINEMET CR) 50-200 MG tablet Take 1 tablet by mouth at bedtime.  . carbidopa-levodopa (SINEMET IR) 25-100 MG tablet Take 1 tablet by mouth 3 (three) times daily.  Marland Kitchen ENSURE (ENSURE) Take 237 mLs by mouth. One daily with lunch  . levothyroxine (SYNTHROID, LEVOTHROID) 75 MCG tablet Take 1 tablet (75 mcg total) by mouth daily.  . QUEtiapine (SEROQUEL) 25 MG tablet Take 1 tablet (25 mg total) by mouth at bedtime.  Marland Kitchen trimethoprim (TRIMPEX) 100 MG tablet Take 100 mg by mouth daily.  . Vitamin D, Ergocalciferol, (DRISDOL) 50000 units CAPS capsule Take 1 capsule (50,000 Units total) by mouth every 7 (seven) days.   No facility-administered encounter medications on file as of 01/03/2018.     Allergies (verified) Patient has no known allergies.   History: Past Medical History:  Diagnosis Date  . COPD (chronic obstructive pulmonary disease) (HCC)   .  Dysarthria   . Falls   . Gastroenteritis   . Hypothyroid   . Incontinence   . 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 Rectal prolapse   . Recurrent UTI    Past Surgical History:  Procedure Laterality Date  . CATARACT EXTRACTION, BILATERAL    . CHOLECYSTECTOMY, LAPAROSCOPIC  2015   Dr. Arville Care   . FEMUR FRACTURE SURGERY    . REPAIR OF RECTAL PROLAPSE    . TONSILLECTOMY AND ADENOIDECTOMY     Family History  Problem Relation Age of Onset  . Heart failure Mother 98  . Lymphoma Father 41  . Parkinson's disease Brother   . Brain cancer Brother 85   Social History   Socioeconomic History  . Marital status: Married    Spouse name: Not on file  . Number of children: Not on file  . Years of education: Not on file  . Highest education level: Not on file  Occupational History  . Not on file  Social Needs  . Financial resource strain: Not hard at all  . Food insecurity:    Worry: Never true    Inability: Never true  . Transportation needs:    Medical: No    Non-medical: No  Tobacco Use  . Smoking status: Never Smoker  . Smokeless tobacco: Never Used  Substance and Sexual Activity  . Alcohol use: No  . Drug use: No  . Sexual activity: Not on file  Lifestyle  . Physical activity:    Days  per week: 0 days    Minutes per session: 0 min  . Stress: Not on file  Relationships  . Social connections:    Talks on phone: More than three times a week    Gets together: More than three times a week    Attends religious service: Never    Active member of club or organization: No    Attends meetings of clubs or organizations: Never    Relationship status: Married  Other Topics Concern  . Not on file  Social History Narrative   Moved to [facility] 05/27/2016   No tobacco use ever   . No alcohol use.    Does drinks/ eats things with caffeine.    Married in [place] lives in a 3 story apartment building with 2 other people and no pets.    Current or  past profession; day care worker.    Does not exercise.    Has a living will, DNR and POA/HPOA.     Tobacco Counseling Counseling given: Not Answered   Clinical Intake:  Pre-visit preparation completed: No  Pain : No/denies pain     Diabetes: No  What is the last grade level you completed in school?: 1 year college  Interpreter Needed?: No  Information entered by :: Tyron Russell, RN   Activities of Daily Living In your present state of health, do you have any difficulty performing the following activities: 01/03/2018  Hearing? N  Vision? N  Difficulty concentrating or making decisions? Y  Walking or climbing stairs? Y  Dressing or bathing? Y  Doing errands, shopping? Y  Preparing Food and eating ? Y  Using the Toilet? Y  In the past six months, have you accidently leaked urine? Y  Do you have problems with loss of bowel control? Y  Managing your Medications? Y  Managing your Finances? Y  Housekeeping or managing your Housekeeping? Y  Some recent data might be hidden     Immunizations and Health Maintenance Immunization History  Administered Date(s) Administered  . Influenza, High Dose Seasonal PF 12/13/2016  . Influenza-Unspecified 01/20/2016   Health Maintenance Due  Topic Date Due  . DEXA SCAN  01/30/2000    Patient Care Team: Oneal Grout, MD as PCP - General (Internal Medicine)  Indicate any recent Medical Services you may have received from other than [facility] providers in the past year (date may be approximate).     Assessment:   This is a routine wellness examination for [place].  Hearing/Vision screen Hearing Screening Comments: Patient's husband reports no issues with hearing Vision Screening Comments: Sees eye doctor annually  Dietary issues and exercise activities discussed: Current Exercise Habits: The patient does not participate in regular exercise at present, Exercise limited by: neurologic condition(s);orthopedic  condition(s)  Goals   None    Depression Screen PHQ 2/9 Scores 01/03/2018 07/12/2016  PHQ - 2 Score 0 0    Fall Risk Fall Risk  01/03/2018 01/03/2018 12/06/2017 07/04/2017 02/21/2017  Falls in the past year? No No No No No  Number falls in past yr: - - - - -  Injury with Fall? - - - - -  Risk Factor Category  - - - - -  Follow up - - - - -    Is the patient's home free of loose throw rugs in walkways, pet beds, electrical cords, etc?   yes      Grab bars in the bathroom? yes      Handrails on  the stairs?   yes      Adequate lighting?   yes  Cognitive Function: MMSE - Mini Mental State Exam 01/03/2018 01/03/2018 07/04/2017 02/21/2017 11/17/2016  Not completed: Unable to complete Unable to complete Unable to complete Unable to complete Unable to complete   [facility] Cognitive Assessment  08/04/2016  Visuospatial/ Executive (0/5) 0  Naming (0/3) 3  Attention: Read list of digits (0/2) 1  Attention: Read list of letters (0/1) 0  Attention: Serial 7 subtraction starting at 100 (0/3) 0      Screening Tests Health Maintenance  Topic Date Due  . DEXA SCAN  01/30/2000  . INFLUENZA VACCINE  01/18/2018 (Originally 10/19/2017)  . PNA vac Low Risk Adult (1 of 2 - PCV13) 01/18/2018 (Originally 01/30/2000)  . TETANUS/TDAP  12/07/2018 (Originally 01/29/1952)    Qualifies for Shingles Vaccine? Yes, educated and will wait until next year per husband  Cancer Screenings: Lung: Low Dose CT Chest recommended if Age 23-80 years, 30 pack-year currently smoking OR have quit w/in 15years. Patient does not qualify. Breast: Up to date on Mammogram? Yes, excluded Up to date of Bone Density/Dexa? Yes, excluded Colorectal: excluded  Additional Screenings:  Hepatitis C Screening: declined TDAP due: declined Prevnar due: ordered Flu vaccine due: will receive at [facility]    Plan:    I have personally reviewed and addressed the Medicare Annual Wellness questionnaire and have noted the following in the  patient's chart:  A. Medical and social history B. Use of alcohol, tobacco or illicit drugs  C. Current medications and supplements D. Functional ability and status E.  Nutritional status F.  Physical activity G. Advance directives H. List of other physicians I.  Hospitalizations, surgeries, and ER visits in previous 12 months J.  Vitals K. Screenings to include hearing, vision, cognitive, depression L. Referrals and appointments - none  In addition, I have reviewed and discussed with patient certain preventive protocols, quality metrics, and best practice recommendations. A written personalized care plan for preventive services as well as general preventive health recommendations were provided to patient.  See attached scanned questionnaire for additional information.   Signed,   Tyron Russell, RN Nurse Health Advisor  Patient Concerns: None

## 2018-01-03 NOTE — Patient Instructions (Signed)
Lisa Henry , Thank you for taking time to come for your Medicare Wellness Visit. I appreciate your ongoing commitment to your health goals. Please review the following plan we discussed and let me know if I can assist you in the future.   Screening recommendations/referrals: Colonoscopy excluded, over age 82 Mammogram excluded, over age 65 Bone Density excluded Recommended yearly ophthalmology/optometry visit for glaucoma screening and checkup Recommended yearly dental visit for hygiene and checkup  Vaccinations: Influenza vaccine due, please get at [facility] Pneumococcal vaccine 13 due, ordered Tdap vaccine due Shingles vaccine due    Advanced directives: in chart  Conditions/risks identified: none  Next appointment: Dinah, NP 04/10/2018 @ 1pm   Preventive Care 65 Years and Older, Female Preventive care refers to lifestyle choices and visits with your health care provider that can promote health and wellness. What does preventive care include?  A yearly physical exam. This is also called an annual well check.  Dental exams once or twice a year.  Routine eye exams. Ask your health care provider how often you should have your eyes checked.  Personal lifestyle choices, including:  Daily care of your teeth and gums.  Regular physical activity.  Eating a healthy diet.  Avoiding tobacco and drug use.  Limiting alcohol use.  Practicing safe sex.  Taking low-dose aspirin every day.  Taking vitamin and mineral supplements as recommended by your health care provider. What happens during an annual well check? The services and screenings done by your health care provider during your annual well check will depend on your age, overall health, lifestyle risk factors, and family history of disease. Counseling  Your health care provider may ask you questions about your:  Alcohol use.  Tobacco use.  Drug use.  Emotional well-being.  Home and relationship  well-being.  Sexual activity.  Eating habits.  History of falls.  Memory and ability to understand (cognition).  Work and work Astronomer.  Reproductive health. Screening  You may have the following tests or measurements:  Height, weight, and BMI.  Blood pressure.  Lipid and cholesterol levels. These may be checked every 5 years, or more frequently if you are over 73 years old.  Skin check.  Lung cancer screening. You may have this screening every year starting at age 19 if you have a 30-pack-year history of smoking and currently smoke or have quit within the past 15 years.  Fecal occult blood test (FOBT) of the stool. You may have this test every year starting at age 47.  Flexible sigmoidoscopy or colonoscopy. You may have a sigmoidoscopy every 5 years or a colonoscopy every 10 years starting at age 56.  Hepatitis C blood test.  Hepatitis B blood test.  Sexually transmitted disease (STD) testing.  Diabetes screening. This is done by checking your blood sugar (glucose) after you have not eaten for a while (fasting). You may have this done every 1-3 years.  Bone density scan. This is done to screen for osteoporosis. You may have this done starting at age 66.  Mammogram. This may be done every 1-2 years. Talk to your health care provider about how often you should have regular mammograms. Talk with your health care provider about your test results, treatment options, and if necessary, the need for more tests. Vaccines  Your health care provider may recommend certain vaccines, such as:  Influenza vaccine. This is recommended every year.  Tetanus, diphtheria, and acellular pertussis (Tdap, Td) vaccine. You may need a Td booster every 10  years.  Zoster vaccine. You may need this after age 92.  Pneumococcal 13-valent conjugate (PCV13) vaccine. One dose is recommended after age 61.  Pneumococcal polysaccharide (PPSV23) vaccine. One dose is recommended after age  72. Talk to your health care provider about which screenings and vaccines you need and how often you need them. This information is not intended to replace advice given to you by your health care provider. Make sure you discuss any questions you have with your health care provider. Document Released: 04/03/2015 Document Revised: 11/25/2015 Document Reviewed: 01/06/2015 Elsevier Interactive Patient Education  2017 [place] Prevention in the Home Falls can cause injuries. They can happen to people of all ages. There are many things you can do to make your home safe and to help prevent falls. What can I do on the outside of my home?  Regularly fix the edges of walkways and driveways and fix any cracks.  Remove anything that might make you trip as you walk through a door, such as a raised step or threshold.  Trim any bushes or trees on the path to your home.  Use bright outdoor lighting.  Clear any walking paths of anything that might make someone trip, such as rocks or tools.  Regularly check to see if handrails are loose or broken. Make sure that both sides of any steps have handrails.  Any raised decks and porches should have guardrails on the edges.  Have any leaves, snow, or ice cleared regularly.  Use sand or salt on walking paths during winter.  Clean up any spills in your garage right away. This includes oil or grease spills. What can I do in the bathroom?  Use night lights.  Install grab bars by the toilet and in the tub and shower. Do not use towel bars as grab bars.  Use non-skid mats or decals in the tub or shower.  If you need to sit down in the shower, use a plastic, non-slip stool.  Keep the floor dry. Clean up any water that spills on the floor as soon as it happens.  Remove soap buildup in the tub or shower regularly.  Attach bath mats securely with double-sided non-slip rug tape.  Do not have throw rugs and other things on the floor that can make  you trip. What can I do in the bedroom?  Use night lights.  Make sure that you have a light by your bed that is easy to reach.  Do not use any sheets or blankets that are too big for your bed. They should not hang down onto the floor.  Have a firm chair that has side arms. You can use this for support while you get dressed.  Do not have throw rugs and other things on the floor that can make you trip. What can I do in the kitchen?  Clean up any spills right away.  Avoid walking on wet floors.  Keep items that you use a lot in easy-to-reach places.  If you need to reach something above you, use a strong step stool that has a grab bar.  Keep electrical cords out of the way.  Do not use floor polish or wax that makes floors slippery. If you must use wax, use non-skid floor wax.  Do not have throw rugs and other things on the floor that can make you trip. What can I do with my stairs?  Do not leave any items on the stairs.  Make sure that  there are handrails on both sides of the stairs and use them. Fix handrails that are broken or loose. Make sure that handrails are as long as the stairways.  Check any carpeting to make sure that it is firmly attached to the stairs. Fix any carpet that is loose or worn.  Avoid having throw rugs at the top or bottom of the stairs. If you do have throw rugs, attach them to the floor with carpet tape.  Make sure that you have a light switch at the top of the stairs and the bottom of the stairs. If you do not have them, ask someone to add them for you. What else can I do to help prevent falls?  Wear shoes that:  Do not have high heels.  Have rubber bottoms.  Are comfortable and fit you well.  Are closed at the toe. Do not wear sandals.  If you use a stepladder:  Make sure that it is fully opened. Do not climb a closed stepladder.  Make sure that both sides of the stepladder are locked into place.  Ask someone to hold it for you, if  possible.  Clearly mark and make sure that you can see:  Any grab bars or handrails.  First and last steps.  Where the edge of each step is.  Use tools that help you move around (mobility aids) if they are needed. These include:  Canes.  Walkers.  Scooters.  Crutches.  Turn on the lights when you go into a dark area. Replace any light bulbs as soon as they burn out.  Set up your furniture so you have a clear path. Avoid moving your furniture around.  If any of your floors are uneven, fix them.  If there are any pets around you, be aware of where they are.  Review your medicines with your doctor. Some medicines can make you feel dizzy. This can increase your chance of falling. Ask your doctor what other things that you can do to help prevent falls. This information is not intended to replace advice given to you by your health care provider. Make sure you discuss any questions you have with your health care provider. Document Released: 01/01/2009 Document Revised: 08/13/2015 Document Reviewed: 04/11/2014 Elsevier Interactive Patient Education  2017 [employer].

## 2018-01-12 DIAGNOSIS — M79672 Pain in left foot: Secondary | ICD-10-CM | POA: Diagnosis not present

## 2018-01-12 DIAGNOSIS — M79671 Pain in right foot: Secondary | ICD-10-CM | POA: Diagnosis not present

## 2018-01-12 DIAGNOSIS — B351 Tinea unguium: Secondary | ICD-10-CM | POA: Diagnosis not present

## 2018-01-12 DIAGNOSIS — L84 Corns and callosities: Secondary | ICD-10-CM | POA: Diagnosis not present

## 2018-01-22 DIAGNOSIS — Z23 Encounter for immunization: Secondary | ICD-10-CM | POA: Diagnosis not present

## 2018-02-02 ENCOUNTER — Other Ambulatory Visit: Payer: Self-pay | Admitting: Neurology

## 2018-02-02 MED ORDER — CARBIDOPA-LEVODOPA ER 50-200 MG PO TBCR: 1.0000 | EXTENDED_RELEASE_TABLET | Freq: Every day | ORAL | 1 refills | Status: DC

## 2018-02-21 ENCOUNTER — Encounter: Payer: Self-pay | Admitting: Family Medicine

## 2018-02-21 ENCOUNTER — Other Ambulatory Visit: Payer: Self-pay | Admitting: Neurology

## 2018-02-21 ENCOUNTER — Non-Acute Institutional Stay: Payer: Medicare Other | Admitting: Family Medicine

## 2018-02-21 VITALS — BP 100/62 | HR 64 | Temp 97.8°F | Ht 64.0 in | Wt 84.0 lb

## 2018-02-21 DIAGNOSIS — F0281 Dementia in other diseases classified elsewhere with behavioral disturbance: Secondary | ICD-10-CM | POA: Diagnosis not present

## 2018-02-21 DIAGNOSIS — G20 Parkinson's disease: Secondary | ICD-10-CM | POA: Diagnosis not present

## 2018-02-21 DIAGNOSIS — E039 Hypothyroidism, unspecified: Secondary | ICD-10-CM

## 2018-02-21 MED ORDER — CARBIDOPA-LEVODOPA 25-100 MG PO TABS: 1.0000 | ORAL_TABLET | Freq: Three times a day (TID) | ORAL | 3 refills | Status: DC

## 2018-02-21 NOTE — Progress Notes (Signed)
[facility]   Place of Service:  Clinic [id number])  Provider: Jacalyn LefevreStephen Heliodoro Domagalski MD  Code Status:  Goals of Care:  Advanced Directives 02/21/2018  Does Patient Have a Medical Advance Directive? No  Type of Advance Directive -  Does patient want to make changes to medical advance directive? -  Copy of Healthcare Power of Attorney in Chart? -  Would patient like information on creating a medical advance directive? No - Patient declined     Chief Complaint  Patient presents with  . Dementia    discuss dementia forms    HPI: Patient is a 84 y.o. female seen today for medical management of chronic diseases including Parkinson's disease with dementia thyroidism, urinary tract infection she is accompanied by her husband and daughter today a form that needs to be completed annually daycare.  She is severely limited these.  She is bed and wheelchair bound she is able to speak in sentences and gives 1 or 2 word answers.  She still eats pretty well but has to have her food cut into small bites.  History of dysphagia or aspiration.  She is also followed by neurology her Parkinson's medicines are managed.  She takes trimethoprim daily if prevention of urinary tract infections.  She also takes levothyroxine for hypothyroidism.  Past Medical History:  Diagnosis Date  . COPD (chronic obstructive pulmonary disease) (HCC)   . Dysarthria   . Falls   . Gastroenteritis   . Hypothyroid   . Incontinence   . 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 Rectal prolapse   . Recurrent UTI     Past Surgical History:  Procedure Laterality Date  . CATARACT EXTRACTION, BILATERAL    . CHOLECYSTECTOMY, LAPAROSCOPIC  2015   Dr. Arville Careepeppi   . FEMUR FRACTURE SURGERY    . REPAIR OF RECTAL PROLAPSE    . TONSILLECTOMY AND ADENOIDECTOMY      No Known Allergies  Outpatient Encounter Medications as of 02/21/2018  Medication Sig  . carbidopa-levodopa (SINEMET CR) 50-200 MG tablet  Take 1 tablet by mouth at bedtime.  . carbidopa-levodopa (SINEMET IR) 25-100 MG tablet Take 1 tablet by mouth 3 (three) times daily.  Marland Kitchen. ENSURE (ENSURE) Take 237 mLs by mouth. One daily with lunch  . levothyroxine (SYNTHROID, LEVOTHROID) 75 MCG tablet Take 1 tablet (75 mcg total) by mouth daily.  . QUEtiapine (SEROQUEL) 25 MG tablet Take 1 tablet (25 mg total) by mouth at bedtime.  Marland Kitchen. trimethoprim (TRIMPEX) 100 MG tablet Take 100 mg by mouth daily.  . Vitamin D, Ergocalciferol, (DRISDOL) 50000 units CAPS capsule Take 1 capsule (50,000 Units total) by mouth every 7 (seven) days.   No facility-administered encounter medications on file as of 02/21/2018.     Review of Systems:  Review of Systems  Constitutional: Negative.   HENT: Negative.   Respiratory: Negative.   Cardiovascular: Negative.   Gastrointestinal: Negative.   Genitourinary: Positive for frequency.  Neurological: Positive for tremors, speech difficulty and weakness.  Psychiatric/Behavioral: Positive for decreased concentration and hallucinations.    Health Maintenance  Topic Date Due  . DEXA SCAN  01/30/2000  . TETANUS/TDAP  12/07/2018 (Originally 01/29/1952)  . PNA vac Low Risk Adult (2 of 2 - PPSV23) 01/23/2019  . INFLUENZA VACCINE  Completed    Physical Exam: Vitals:   02/21/18 1003  BP: 100/62  Pulse: 64  Temp: 97.8 F (36.6 C)  TempSrc: Oral  SpO2: 91%  Weight: 84  lb (38.1 kg)  Height: 5\' 4"  (1.626 m)   Body mass index is 14.42 kg/m. Physical Exam  Constitutional: She appears well-developed.  Patient is very frail and slightly built she sits in a wheelchair severe leaning  HENT:  Head: Normocephalic.  Mouth/Throat: Oropharynx is clear and moist.  Cardiovascular: Normal rate and regular rhythm.  Pulmonary/Chest: Effort normal and breath sounds normal.  Abdominal: Soft.  Neurological: She is alert.  Patient seemed to be sleeping during most of our visit but she did rales up to give simple answers and  really became more work her daughter entered the room. She was unable to do finger to thumb rapid alternating movements for short time.  There was no cogwheeling rigidity noted. Her deep tendon reflexes were 2-3+ throughout including biceps and patellar  Skin: Skin is warm. No rash noted.  Psychiatric: She has a normal mood and affect.  There has been history of hallucinations they seem to have improved.  This may be attributable to use of Seroquel  Nursing note and vitals reviewed.   Labs reviewed: Basic Metabolic Panel: Recent Labs    04/13/17 0802 05/31/17 0000 07/24/17 0800 08/24/17 0000 09/25/17 0000 11/27/17 1000  NA 141 141  --   --   --  139  K 4.2 4.4  --   --   --  4.5  CL 105 107  --   --   --  103  CO2 28 27  --   --   --  27  GLUCOSE 79 87  --   --   --  73  BUN 32* 33*  --   --   --  33*  CREATININE 0.95* 0.94*  --   --   --  1.18*  CALCIUM 9.3 9.6  --   --   --  9.4  TSH 0.16* 0.22* 11.67* 22.17* 1.69  --    Liver Function Tests: Recent Labs    04/13/17 0802 05/31/17 0000 11/27/17 1000  AST 13 15 15   ALT 3* <3* 3*  BILITOT 0.5 0.6 0.4  PROT 6.9 6.7 6.8   No results for input(s): LIPASE, AMYLASE in the last 8760 hours. No results for input(s): AMMONIA in the last 8760 hours. CBC: Recent Labs    04/13/17 0802 05/31/17 0000 11/27/17 1000  WBC 6.9 6.3 7.9  NEUTROABS 4,347 3,383  --   HGB 12.9 13.0 13.9  HCT 39.8 38.8 41.4  MCV 88.8 88.4 89.0  PLT 245 231 272   Lipid Panel: Recent Labs    04/13/17 0802 05/31/17 0000 11/27/17 1000  CHOL 168 170 177  HDL 46* 43* 46*  LDLCALC 102* 108* 111*  TRIG 108 92 95  CHOLHDL 3.7 4.0 3.8   Lab Results  Component Value Date   HGBA1C 6.2 12/22/2015    Procedures since last visit: No results found.  Assessment/Plan 1. Acquired hypothyroidism Last TSH was done in July was in therapeutic range.  Will check annually  2. Parkinson disease (HCC) There is no tremor is some stiffness.  Takes  combination of long-acting and short acting Sinemet  3. Dementia due to Parkinson's disease with behavioral disturbance (HCC) Dementia is certainly present.  This is thought to be the result of her Parkinson's disease.   Labs/tests ordered:  @ORDERS @ Next appt:  03/26/2018  Bertram Millard. Hyacinth Meeker, MD [facility] [address] [place], [place] [address] Office [phone]

## 2018-02-26 DIAGNOSIS — K625 Hemorrhage of anus and rectum: Secondary | ICD-10-CM | POA: Diagnosis not present

## 2018-03-26 ENCOUNTER — Other Ambulatory Visit: Payer: Medicare Other

## 2018-03-26 DIAGNOSIS — E559 Vitamin D deficiency, unspecified: Secondary | ICD-10-CM | POA: Diagnosis not present

## 2018-03-26 DIAGNOSIS — N183 Chronic kidney disease, stage 3 unspecified: Secondary | ICD-10-CM

## 2018-03-26 DIAGNOSIS — E039 Hypothyroidism, unspecified: Secondary | ICD-10-CM | POA: Diagnosis not present

## 2018-03-27 LAB — COMPLETE METABOLIC PANEL WITH GFR
AG Ratio: 1.3 (calc) (ref 1.0–2.5)
ALT: 4 U/L — ABNORMAL LOW (ref 6–29)
AST: 23 U/L (ref 10–35)
Albumin: 4 g/dL (ref 3.6–5.1)
Alkaline phosphatase (APISO): 51 U/L (ref 33–130)
BUN/Creatinine Ratio: 34 (calc) — ABNORMAL HIGH (ref 6–22)
BUN: 33 mg/dL — AB (ref 7–25)
CO2: 28 mmol/L (ref 20–32)
CREATININE: 0.96 mg/dL — AB (ref 0.60–0.88)
Calcium: 9.5 mg/dL (ref 8.6–10.4)
Chloride: 105 mmol/L (ref 98–110)
GFR, Est African American: 63 mL/min/{1.73_m2} (ref >=60)
GFR, Est Non African American: 55 mL/min/{1.73_m2} — ABNORMAL LOW (ref >=60)
Globulin: 3 g/dL (calc) (ref 1.9–3.7)
Glucose, Bld: 89 mg/dL (ref 65–99)
Potassium: 4.9 mmol/L (ref 3.5–5.3)
Sodium: 141 mmol/L (ref 135–146)
Total Bilirubin: 0.5 mg/dL (ref 0.2–1.2)
Total Protein: 7 g/dL (ref 6.1–8.1)

## 2018-03-27 LAB — TSH: TSH: 1.61 mIU/L (ref 0.40–4.50)

## 2018-03-27 LAB — VITAMIN D 25 HYDROXY (VIT D DEFICIENCY, FRACTURES): Vit D, 25-Hydroxy: 33 ng/mL (ref 30–100)

## 2018-04-02 DIAGNOSIS — H35363 Drusen (degenerative) of macula, bilateral: Secondary | ICD-10-CM | POA: Diagnosis not present

## 2018-04-04 ENCOUNTER — Encounter: Payer: Self-pay | Admitting: Internal Medicine

## 2018-04-04 ENCOUNTER — Non-Acute Institutional Stay: Payer: Medicare Other | Admitting: Internal Medicine

## 2018-04-04 VITALS — BP 100/60 | HR 76 | Temp 97.5°F | Ht 64.0 in | Wt 81.0 lb

## 2018-04-04 DIAGNOSIS — E039 Hypothyroidism, unspecified: Secondary | ICD-10-CM

## 2018-04-04 DIAGNOSIS — N183 Chronic kidney disease, stage 3 unspecified: Secondary | ICD-10-CM

## 2018-04-04 DIAGNOSIS — G20 Parkinson's disease: Secondary | ICD-10-CM | POA: Diagnosis not present

## 2018-04-04 DIAGNOSIS — F0281 Dementia in other diseases classified elsewhere with behavioral disturbance: Secondary | ICD-10-CM

## 2018-04-04 DIAGNOSIS — R634 Abnormal weight loss: Secondary | ICD-10-CM

## 2018-04-04 NOTE — Progress Notes (Signed)
[facility] Service:  Clinic (12)  Provider:   Code Status:  Goals of Care:  Advanced Directives 04/04/2018  Does Patient Have a Medical Advance Directive? Yes  Type of Advance Directive -  Does patient want to make changes to medical advance directive? No - Patient declined  Copy of Healthcare Power of Attorney in Chart? -  Would patient like information on creating a medical advance directive? No - Patient declined     Chief Complaint  Patient presents with  . Medical Management of Chronic Issues    routine follow up, disucss lab work, duse for tdap vaccine      HPI: Patient is a 85 y.o. female seen today for an acute visit for Follow up. Patient has h/o Parkinson Disease, With Dementia, Recurrent UTI, Hypothyroidism  Patient came with her Husband. She is Wheelchair bound and completely dependent on him for ADLS.She does answer in one word. Follows some simple commands. She though can feed herself. They live in ILF. She does go to Memory clinic in [place] 3 /week. Patient weight today was 81 pounds. Which is drop of 11 lbs in past 3 months. Per husband nothing has changed and she is eating same amount of food. No other Acute complains today.  She is incontinent of both Bowel and Urine. Sleeping well at night with Seroquel. No Recent Falls  Past Medical History:  Diagnosis Date  . COPD (chronic obstructive pulmonary disease) (HCC)   . Dysarthria   . Falls   . Gastroenteritis   . Hypothyroid   . Incontinence   . 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 Rectal prolapse   . Recurrent UTI     Past Surgical History:  Procedure Laterality Date  . CATARACT EXTRACTION, BILATERAL    . CHOLECYSTECTOMY, LAPAROSCOPIC  2015   Dr. Arville Care   . FEMUR FRACTURE SURGERY    . REPAIR OF RECTAL PROLAPSE    . TONSILLECTOMY AND ADENOIDECTOMY      No Known Allergies  Outpatient Encounter Medications as of 04/04/2018  Medication  Sig  . carbidopa-levodopa (SINEMET CR) 50-200 MG tablet Take 1 tablet by mouth at bedtime.  . carbidopa-levodopa (SINEMET IR) 25-100 MG tablet Take 1 tablet by mouth 3 (three) times daily.  Marland Kitchen ENSURE (ENSURE) Take 237 mLs by mouth. One daily with lunch  . levothyroxine (SYNTHROID, LEVOTHROID) 75 MCG tablet Take 1 tablet (75 mcg total) by mouth daily.  . QUEtiapine (SEROQUEL) 25 MG tablet Take 1 tablet (25 mg total) by mouth at bedtime.  Marland Kitchen trimethoprim (TRIMPEX) 100 MG tablet Take 100 mg by mouth daily.  . [DISCONTINUED] Vitamin D, Ergocalciferol, (DRISDOL) 50000 units CAPS capsule Take 1 capsule (50,000 Units total) by mouth every 7 (seven) days.   No facility-administered encounter medications on file as of 04/04/2018.     Review of Systems:  Review of Systems  Unable to perform ROS: Dementia    Health Maintenance  Topic Date Due  . DEXA SCAN  01/30/2000  . TETANUS/TDAP  12/07/2018 (Originally 01/29/1952)  . PNA vac Low Risk Adult (2 of 2 - PPSV23) 01/23/2019  . INFLUENZA VACCINE  Completed    Physical Exam: Vitals:   04/04/18 1407  BP: 100/60  Pulse: 76  Temp: (!) 97.5 F (36.4 C)  TempSrc: Oral  SpO2: 91%  Weight: 81 lb (36.7 kg)  Height: 5\' 4"  (1.626 m)   Body mass index is 13.9 kg/m. Physical Exam  Constitutional:      Comments: Bend Down in wheelchair. Has Kyphosis  HENT:     Head: Normocephalic.     Nose: Nose normal.     Mouth/Throat:     Mouth: Mucous membranes are moist.     Pharynx: Oropharynx is clear.  Eyes:     Pupils: Pupils are equal, round, and reactive to light.  Neck:     Musculoskeletal: Neck supple.  Cardiovascular:     Rate and Rhythm: Normal rate and regular rhythm.     Pulses: Normal pulses.     Heart sounds: Normal heart sounds.  Pulmonary:     Effort: Pulmonary effort is normal. No respiratory distress.     Breath sounds: Normal breath sounds. No wheezing or rales.  Abdominal:     General: Abdomen is flat. Bowel sounds are normal.  There is no distension.     Palpations: Abdomen is soft.     Tenderness: There is no abdominal tenderness. There is no guarding.  Neurological:     Mental Status: She is alert.     Comments: Has Rigidity in all extremities.  Psychiatric:        Mood and Affect: Mood normal.        Thought Content: Thought content normal.     Labs reviewed: Basic Metabolic Panel: Recent Labs    05/31/17 0000  08/24/17 0000 09/25/17 0000 11/27/17 1000 03/26/18 0000  NA 141  --   --   --  139 141  K 4.4  --   --   --  4.5 4.9  CL 107  --   --   --  103 105  CO2 27  --   --   --  27 28  GLUCOSE 87  --   --   --  73 89  BUN 33*  --   --   --  33* 33*  CREATININE 0.94*  --   --   --  1.18* 0.96*  CALCIUM 9.6  --   --   --  9.4 9.5  TSH 0.22*   < > 22.17* 1.69  --  1.61   < > = values in this interval not displayed.   Liver Function Tests: Recent Labs    05/31/17 0000 11/27/17 1000 03/26/18 0000  AST 15 15 23   ALT <3* 3* 4*  BILITOT 0.6 0.4 0.5  PROT 6.7 6.8 7.0   No results for input(s): LIPASE, AMYLASE in the last 8760 hours. No results for input(s): AMMONIA in the last 8760 hours. CBC: Recent Labs    04/13/17 0802 05/31/17 0000 11/27/17 1000  WBC 6.9 6.3 7.9  NEUTROABS 4,347 3,383  --   HGB 12.9 13.0 13.9  HCT 39.8 38.8 41.4  MCV 88.8 88.4 89.0  PLT 245 231 272   Lipid Panel: Recent Labs    04/13/17 0802 05/31/17 0000 11/27/17 1000  CHOL 168 170 177  HDL 46* 43* 46*  LDLCALC 102* 108* 111*  TRIG 108 92 95  CHOLHDL 3.7 4.0 3.8   Lab Results  Component Value Date   HGBA1C 6.2 12/22/2015    Procedures since last visit: No results found.  Assessment/Plan Weight Loss I D/W the Patient husband who is her primary caregiver. According to him Nothing has changed recently to explain her weight loss. At this time he is not interested in aggressive testing We will refer her to Dietary consult to increase her Calorie intake. As he cannot weigh her regularly  at home we  will have her come back in 1 month to follow her weight. Her Labs are stable Parkinson disease  On Sinemet. Not Mobile anymore . Mostly Wheelchair Bound and dependent for her ADL  Dementia Behavior stable on Seroquel at night  CKD (chronic kidney disease) stage 3, GFR 30-59 ml/min Creat Stable. Hypothyroid TSH was normal On Levothyroxine Recurrent UTI Continue on trimethoprim VIT D Def She is not taking Vit D anymore     Labs/tests ordered:  Next appt:  1 Month for weight Loss

## 2018-04-05 ENCOUNTER — Encounter: Payer: Self-pay | Admitting: Family

## 2018-04-10 ENCOUNTER — Encounter: Payer: Self-pay | Admitting: Family

## 2018-04-17 ENCOUNTER — Other Ambulatory Visit: Payer: Self-pay | Admitting: Neurology

## 2018-04-17 MED ORDER — QUETIAPINE FUMARATE 25 MG PO TABS: 25.0000 mg | ORAL_TABLET | Freq: Every day | ORAL | 2 refills | Status: DC

## 2018-04-18 ENCOUNTER — Encounter: Payer: Self-pay | Admitting: Family Medicine

## 2018-04-19 ENCOUNTER — Telehealth: Payer: Self-pay | Admitting: Neurology

## 2018-04-19 NOTE — Telephone Encounter (Signed)
This was sent via escribe on 04/17/2018. It was confirmed that it was received. I printed this and faxed to [facility] to have them please fill for patient. Confirmation received.

## 2018-04-19 NOTE — Telephone Encounter (Signed)
[facility] called him about the Quetiapine medication needing a refill. Please send this.Thank you.

## 2018-05-09 ENCOUNTER — Non-Acute Institutional Stay: Payer: Medicare Other | Admitting: Internal Medicine

## 2018-05-09 ENCOUNTER — Encounter: Payer: Self-pay | Admitting: Internal Medicine

## 2018-05-09 VITALS — BP 98/60 | HR 78 | Temp 97.6°F | Ht 64.0 in | Wt 91.4 lb

## 2018-05-09 DIAGNOSIS — G20 Parkinson's disease: Secondary | ICD-10-CM

## 2018-05-09 DIAGNOSIS — N390 Urinary tract infection, site not specified: Secondary | ICD-10-CM | POA: Diagnosis not present

## 2018-05-09 DIAGNOSIS — F0281 Dementia in other diseases classified elsewhere with behavioral disturbance: Secondary | ICD-10-CM

## 2018-05-09 DIAGNOSIS — R634 Abnormal weight loss: Secondary | ICD-10-CM | POA: Diagnosis not present

## 2018-05-09 DIAGNOSIS — E039 Hypothyroidism, unspecified: Secondary | ICD-10-CM

## 2018-05-09 NOTE — Progress Notes (Signed)
[facility]  Cl[facility] (12)  Provider:   Code Status:  Goals of Care:  Advanced Directives 05/09/2018  Does Patient Have a Medical Advance Directive? Yes  Type of Advance Directive -  Does patient want to make changes to medical advance directive? No - Patient declined  Copy of Healthcare Power of Attorney in Chart? -  Would patient like information on creating a medical advance directive? -     Chief Complaint  Patient presents with  . Medical Management of Chronic Issues    1 month follow up     HPI: Patient is a 85 y.o. female seen today for an acute visit for Follow up of her weight. Patient has h/o Parkinson Disease, With Dementia, Recurrent UTI, Hypothyroidism  Patient comes with her Husband. She is Wheelchair bound and completely dependent on him for ADLS.She does answer in one word. Follows some simple commands. She though can feed herself. They live in ILF. She does go to Memory clinic in [place] 3 /week. She came for Follow up as she had lost almost 11 lbs last visit and was down to 81 lbs. Her husband saw in facility Dietician and I also Spoke with dietician. He suggested some ways to increase her Calorie Intake. Including Boost Supplements. Today her weight is 91.4 which is gain of almost 10 lbs in 4 weeks. Since then Per husband he is keeping close eye on her intake and calories that she is taking. Patient is dependent for  her care on him and I don't think can tell him when she is hungry. No New Acute issues today  Past Medical History:  Diagnosis Date  . COPD (chronic obstructive pulmonary disease) (HCC)   . Dysarthria   . Falls   . Gastroenteritis   . Hypothyroid   . Incontinence   . 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 Rectal prolapse   . Recurrent UTI     Past Surgical History:  Procedure Laterality Date  . CATARACT EXTRACTION, BILATERAL    . CHOLECYSTECTOMY, LAPAROSCOPIC  2015   Dr. Arville Care   . FEMUR FRACTURE SURGERY    . REPAIR OF RECTAL PROLAPSE    . TONSILLECTOMY AND ADENOIDECTOMY      No Known Allergies  Outpatient Encounter Medications as of 05/09/2018  Medication Sig  . carbidopa-levodopa (SINEMET CR) 50-200 MG tablet Take 1 tablet by mouth at bedtime.  . carbidopa-levodopa (SINEMET IR) 25-100 MG tablet Take 1 tablet by mouth 3 (three) times daily.  Marland Kitchen ENSURE (ENSURE) Take 237 mLs by mouth. One daily with lunch  . levothyroxine (SYNTHROID, LEVOTHROID) 75 MCG tablet Take 1 tablet (75 mcg total) by mouth daily.  . QUEtiapine (SEROQUEL) 25 MG tablet Take 1 tablet (25 mg total) by mouth at bedtime.  Marland Kitchen trimethoprim (TRIMPEX) 100 MG tablet Take 100 mg by mouth daily.   No facility-administered encounter medications on file as of 05/09/2018.     Review of Systems:  Review of Systems  Unable to perform ROS: Dementia    Health Maintenance  Topic Date Due  . DEXA SCAN  01/30/2000  . TETANUS/TDAP  12/07/2018 (Originally 01/29/1952)  . PNA vac Low Risk Adult (2 of 2 - PPSV23) 01/23/2019  . INFLUENZA VACCINE  Completed    Physical Exam: Vitals:   05/09/18 1400  BP: 98/60  Pulse: 78  Temp: 97.6 F (36.4 C)  SpO2: 91%  Weight: 91 lb 6.4 oz (41.5  kg)  Height: 5\' 4"  (1.626 m)   Body mass index is 15.69 kg/m. Physical Exam Constitutional:      Comments: Bend Down in wheelchair. Has Kyphosis  HENT:     Head: Normocephalic.     Nose: Nose normal.     Mouth/Throat:     Mouth: Mucous membranes are moist.     Pharynx: Oropharynx is clear.  Eyes:     Pupils: Pupils are equal, round, and reactive to light.  Neck:     Musculoskeletal: Neck supple.  Cardiovascular:     Rate and Rhythm: Normal rate and regular rhythm.     Pulses: Normal pulses.     Heart sounds: Normal heart sounds.  Pulmonary:     Effort: Pulmonary effort is normal. No respiratory distress.     Breath sounds: Normal breath sounds. No wheezing or rales.  Abdominal:     General:  Abdomen is flat. Bowel sounds are normal. There is no distension.     Palpations: Abdomen is soft.     Tenderness: There is no abdominal tenderness. There is no guarding.  Skin:    General: Skin is warm and dry.  Neurological:     Mental Status: She is alert.     Comments: Has Rigidity in all extremities. She was more alert and responsive today  Psychiatric:        Mood and Affect: Mood normal.        Thought Content: Thought content normal.     Labs reviewed: Basic Metabolic Panel: Recent Labs    05/31/17 0000  08/24/17 0000 09/25/17 0000 11/27/17 1000 03/26/18 0000  NA 141  --   --   --  139 141  K 4.4  --   --   --  4.5 4.9  CL 107  --   --   --  103 105  CO2 27  --   --   --  27 28  GLUCOSE 87  --   --   --  73 89  BUN 33*  --   --   --  33* 33*  CREATININE 0.94*  --   --   --  1.18* 0.96*  CALCIUM 9.6  --   --   --  9.4 9.5  TSH 0.22*   < > 22.17* 1.69  --  1.61   < > = values in this interval not displayed.   Liver Function Tests: Recent Labs    05/31/17 0000 11/27/17 1000 03/26/18 0000  AST 15 15 23   ALT <3* 3* 4*  BILITOT 0.6 0.4 0.5  PROT 6.7 6.8 7.0   No results for input(s): LIPASE, AMYLASE in the last 8760 hours. No results for input(s): AMMONIA in the last 8760 hours. CBC: Recent Labs    05/31/17 0000 11/27/17 1000  WBC 6.3 7.9  NEUTROABS 3,383  --   HGB 13.0 13.9  HCT 38.8 41.4  MCV 88.4 89.0  PLT 231 272   Lipid Panel: Recent Labs    05/31/17 0000 11/27/17 1000  CHOL 170 177  HDL 43* 46*  LDLCALC 108* 016*  TRIG 92 95  CHOLHDL 4.0 3.8   Lab Results  Component Value Date   HGBA1C 6.2 12/22/2015    Procedures since last visit: No results found.  Assessment/Plan Weight Loss Her weight is up. Husband is following her Calorie Intake more regularly. He had told me before that he wants to be conservative in any aggressive testing for her  IParkinson disease  On Sinemet. Not Mobile anymore . Mostly Wheelchair Bound and  dependent for her ADL Follows with Neurology  Dementia Behavior stable on Seroquel at night  CKD (chronic kidney disease) stage 3, GFR 30-59 ml/min Creat Stable. Repeat BMP Hypothyroid TSH was normal On Levothyroxine Recurrent UTI Continue on trimethoprim VIT D Def She is not taking Vit D anymore Will repeat the Level She does get Fortified Drinks    Labs/tests ordered:  ordered Next appt:  4 months follow up

## 2018-06-01 DIAGNOSIS — L84 Corns and callosities: Secondary | ICD-10-CM | POA: Diagnosis not present

## 2018-06-01 DIAGNOSIS — M79672 Pain in left foot: Secondary | ICD-10-CM | POA: Diagnosis not present

## 2018-06-01 DIAGNOSIS — M79671 Pain in right foot: Secondary | ICD-10-CM | POA: Diagnosis not present

## 2018-06-01 DIAGNOSIS — B351 Tinea unguium: Secondary | ICD-10-CM | POA: Diagnosis not present

## 2018-08-08 ENCOUNTER — Other Ambulatory Visit: Payer: Self-pay

## 2018-08-08 MED ORDER — CARBIDOPA-LEVODOPA ER 50-200 MG PO TBCR: 1.0000 | EXTENDED_RELEASE_TABLET | Freq: Every day | ORAL | 1 refills | Status: DC

## 2018-08-08 NOTE — Telephone Encounter (Signed)
Requested Prescriptions   Pending Prescriptions Disp Refills  . carbidopa-levodopa (SINEMET CR) 50-200 MG tablet 90 tablet 1    Sig: Take 1 tablet by mouth at bedtime.   Last seen: 01/03/18 Last filled: 02/02/18 #90 1 refill Follow up: 01/07/19  Approved

## 2018-08-09 ENCOUNTER — Other Ambulatory Visit: Payer: Self-pay | Admitting: *Deleted

## 2018-08-09 MED ORDER — LEVOTHYROXINE SODIUM 75 MCG PO TABS: 75.0000 ug | ORAL_TABLET | Freq: Every day | ORAL | 1 refills | Status: AC

## 2018-08-09 NOTE — Telephone Encounter (Signed)
Patient husband requested refill to be sent to [facility].

## 2018-08-28 DIAGNOSIS — Z20828 Contact with and (suspected) exposure to other viral communicable diseases: Secondary | ICD-10-CM | POA: Diagnosis not present

## 2018-09-06 ENCOUNTER — Other Ambulatory Visit: Payer: Self-pay

## 2018-09-06 ENCOUNTER — Emergency Department (HOSPITAL_COMMUNITY): Payer: Medicare Other

## 2018-09-06 ENCOUNTER — Emergency Department (HOSPITAL_COMMUNITY)
Admission: EM | Admit: 2018-09-06 | Discharge: 2018-09-06 | Disposition: A | Discharge status: Home / Self Care | Payer: Medicare Other | Attending: Emergency Medicine | Admitting: Emergency Medicine

## 2018-09-06 ENCOUNTER — Other Ambulatory Visit: Payer: Medicare Other

## 2018-09-06 DIAGNOSIS — R634 Abnormal weight loss: Secondary | ICD-10-CM

## 2018-09-06 DIAGNOSIS — R079 Chest pain, unspecified: Secondary | ICD-10-CM | POA: Diagnosis not present

## 2018-09-06 DIAGNOSIS — Z79899 Other long term (current) drug therapy: Secondary | ICD-10-CM | POA: Insufficient documentation

## 2018-09-06 DIAGNOSIS — G20 Parkinson's disease: Secondary | ICD-10-CM | POA: Diagnosis not present

## 2018-09-06 DIAGNOSIS — J449 Chronic obstructive pulmonary disease, unspecified: Secondary | ICD-10-CM | POA: Insufficient documentation

## 2018-09-06 DIAGNOSIS — E039 Hypothyroidism, unspecified: Secondary | ICD-10-CM | POA: Insufficient documentation

## 2018-09-06 DIAGNOSIS — F0281 Dementia in other diseases classified elsewhere with behavioral disturbance: Secondary | ICD-10-CM | POA: Diagnosis not present

## 2018-09-06 DIAGNOSIS — R404 Transient alteration of awareness: Secondary | ICD-10-CM | POA: Diagnosis not present

## 2018-09-06 DIAGNOSIS — R52 Pain, unspecified: Secondary | ICD-10-CM | POA: Diagnosis not present

## 2018-09-06 DIAGNOSIS — R0902 Hypoxemia: Secondary | ICD-10-CM | POA: Diagnosis not present

## 2018-09-06 DIAGNOSIS — K449 Diaphragmatic hernia without obstruction or gangrene: Secondary | ICD-10-CM

## 2018-09-06 DIAGNOSIS — R000 Tachycardia, unspecified: Secondary | ICD-10-CM | POA: Diagnosis not present

## 2018-09-06 LAB — CBC
HCT: 43.8 % (ref 36.0–46.0)
Hemoglobin: 14.1 g/dL (ref 12.0–15.0)
MCH: 30.5 pg (ref 26.0–34.0)
MCHC: 32.2 g/dL (ref 30.0–36.0)
MCV: 94.8 fL (ref 80.0–100.0)
Platelets: 205 10*3/uL (ref 150–400)
RBC: 4.62 MIL/uL (ref 3.87–5.11)
RDW: 13.7 % (ref 11.5–15.5)
WBC: 7.9 10*3/uL (ref 4.0–10.5)
nRBC: 0 % (ref 0.0–0.2)

## 2018-09-06 LAB — TROPONIN I
Troponin I: <0.03 ng/mL (ref <0.03)
Troponin I: <0.03 ng/mL (ref <0.03)

## 2018-09-06 LAB — BASIC METABOLIC PANEL
Anion gap: 10 (ref 5–15)
BUN: 30 mg/dL — ABNORMAL HIGH (ref 8–23)
CO2: 23 mmol/L (ref 22–32)
Calcium: 9.5 mg/dL (ref 8.9–10.3)
Chloride: 110 mmol/L (ref 98–111)
Creatinine, Ser: 1.06 mg/dL — ABNORMAL HIGH (ref 0.44–1.00)
GFR calc Af Amer: 56 mL/min — ABNORMAL LOW (ref >60)
GFR calc non Af Amer: 49 mL/min — ABNORMAL LOW (ref >60)
Glucose, Bld: 91 mg/dL (ref 70–99)
Potassium: 5.4 mmol/L — ABNORMAL HIGH (ref 3.5–5.1)
Sodium: 143 mmol/L (ref 135–145)

## 2018-09-06 MED ORDER — IOHEXOL 350 MG/ML SOLN
75.0000 mL | Freq: Once | INTRAVENOUS | Status: AC | PRN
  Administered 2018-09-06: 75 mL via INTRAVENOUS

## 2018-09-06 NOTE — ED Notes (Signed)
Husband at bedside.  

## 2018-09-06 NOTE — ED Provider Notes (Signed)
[facility]   CSN: [id number] Arrival date & time: 09/06/18  1301     History   Chief Complaint Chief Complaint  Patient presents with  . Chest Pain    HPI Lisa Henry is a 85 y.o. female.  Level 5 caveat secondary to dementia.  [age] female brought in by EMS after her husband noticed her clutching her chest.  Patient does not recall having chest pain and she is a rather poor historian.  She denies any complaints currently.     The history is provided by the EMS personnel, the patient and the spouse.  Chest Pain Pain location:  Substernal area   Past Medical History:  Diagnosis Date  . COPD (chronic obstructive pulmonary disease) (HCC)   . Dysarthria   . Falls   . Gastroenteritis   . Hypothyroid   . Incontinence   . 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 Rectal prolapse   . Recurrent UTI     Patient Active Problem List   Diagnosis Date Noted  . Weight loss 05/09/2018  . CKD (chronic kidney disease) stage 3, GFR 30-59 ml/min (HCC) 12/06/2017  . Dementia due to Parkinson's disease with behavioral disturbance (HCC) 04/05/2017  . Unsteady gait 04/05/2017  . Recurrent UTI 04/05/2017  . Severe protein-calorie malnutrition (HCC) 04/05/2017  . History of pressure ulcer 04/05/2017  . Balance disorder 07/12/2016  . Cystocele with incomplete uterovaginal prolapse 07/12/2016  . Urinary incontinence 07/12/2016  . Parkinson disease (HCC)   . Hypothyroid   . Falls   . Dysarthria     Past Surgical History:  Procedure Laterality Date  . CATARACT EXTRACTION, BILATERAL    . CHOLECYSTECTOMY, LAPAROSCOPIC  2015   Dr. Arville Careepeppi   . FEMUR FRACTURE SURGERY    . REPAIR OF RECTAL PROLAPSE    . TONSILLECTOMY AND ADENOIDECTOMY       OB History   No obstetric history on file.      Home Medications    Prior to Admission medications   Medication Sig Start Date End Date Taking? Authorizing Provider   carbidopa-levodopa (SINEMET CR) 50-200 MG tablet Take 1 tablet by mouth at bedtime. 08/08/18   Tat, Octaviano Battyebecca S, DO  carbidopa-levodopa (SINEMET IR) 25-100 MG tablet Take 1 tablet by mouth 3 (three) times daily. 02/21/18   Tat, Octaviano Battyebecca S, DO  ENSURE (ENSURE) Take 237 mLs by mouth. One daily with lunch    [provider]  levothyroxine (SYNTHROID) 75 MCG tablet Take 1 tablet (75 mcg total) by mouth daily. 08/09/18   Mast, Man X, NP  QUEtiapine (SEROQUEL) 25 MG tablet Take 1 tablet (25 mg total) by mouth at bedtime. 04/17/18   Tat, Octaviano Battyebecca S, DO  trimethoprim (TRIMPEX) 100 MG tablet Take 100 mg by mouth daily.    [provider]    Family History Family History  Problem Relation Age of Onset  . Heart failure Mother 3693  . Lymphoma Father 8964  . Parkinson's disease Brother   . Brain cancer Brother 5542    Social History Social History   Tobacco Use  . Smoking status: Never Smoker  . Smokeless tobacco: Never Used  Substance Use Topics  . Alcohol use: No  . Drug use: No     Allergies   Patient has no known allergies.   Review of Systems Review of Systems  Unable to perform ROS: Dementia  Cardiovascular: Positive for chest pain.  Physical Exam Updated Vital Signs BP 113/90 (BP Location: Right Arm)   Pulse 92   Temp 97.8 F (36.6 C) (Axillary)   Resp (!) 21   SpO2 95%   Physical Exam Vitals signs and nursing note reviewed.  Constitutional:      General: She is not in acute distress.    Appearance: She is underweight.  HENT:     Head: Normocephalic and atraumatic.  Eyes:     Conjunctiva/sclera: Conjunctivae normal.  Neck:     Musculoskeletal: Neck supple.  Cardiovascular:     Rate and Rhythm: Normal rate and regular rhythm.     Heart sounds: No murmur.  Pulmonary:     Effort: Pulmonary effort is normal. No respiratory distress.     Breath sounds: Normal breath sounds.  Abdominal:     Palpations: Abdomen is soft.     Tenderness: There is no  abdominal tenderness.  Musculoskeletal:        General: No tenderness or deformity.     Right lower leg: No edema.     Left lower leg: No edema.  Skin:    General: Skin is warm and dry.     Capillary Refill: Capillary refill takes less than 2 seconds.  Neurological:     Mental Status: She is alert. Mental status is at baseline.     Comments: Patient has a generalized resting tremor.  It is difficult to range of motion in her extremities she is got some rigidity.  No gross deformities.  She is alert although not oriented to time or place.      ED Treatments / Results  Labs (all labs ordered are listed, but only abnormal results are displayed) Labs Reviewed  BASIC METABOLIC PANEL - Abnormal; Notable for the following components:      Result Value   Potassium 5.4 (*)    BUN 30 (*)    Creatinine, Ser 1.06 (*)    GFR calc non Af Amer 49 (*)    GFR calc Af Amer 56 (*)    All other components within normal limits  CBC  TROPONIN I  TROPONIN I    EKG EKG Interpretation  Date/Time:  Thursday September 06 2018 13:14:23 EDT Ventricular Rate:  90 PR Interval:    QRS Duration: 78 QT Interval:  353 QTC Calculation: 432 R Axis:   49 Text Interpretation:  Sinus rhythm Atrial premature complex Anterior infarct, old No old tracing to compare Confirmed by Jola Schmidt [id number]) on 09/06/2018 1:23:58 PM Also confirmed by Aletta Edouard [phone])  on 09/06/2018 1:41:28 PM   Radiology Ct Angio Chest Pe W/cm &/or Wo Cm  Result Date: 09/06/2018 CLINICAL DATA:  Chest pain.  Dementia EXAM: CT ANGIOGRAPHY CHEST WITH CONTRAST TECHNIQUE: Multidetector CT imaging of the chest was performed using the standard protocol during bolus administration of intravenous contrast. Multiplanar CT image reconstructions and MIPs were obtained to evaluate the vascular anatomy. CONTRAST:  48mL OMNIPAQUE IOHEXOL 350 MG/ML SOLN COMPARISON:  None. FINDINGS: Cardiovascular: Heart size normal. No pericardial effusion.  Satisfactory opacification of pulmonary arteries noted, and there is no evidence of pulmonary emboli. Scattered coronary calcifications. Adequate contrast opacification of the thoracic aorta with no evidence of dissection, aneurysm, or stenosis. There is classic 3-vessel brachiocephalic arch anatomy without proximal stenosis. Calcified plaque in the arch and descending thoracic segment. Visualized portions of abdominal aorta unremarkable. Mediastinum/Nodes: Large hiatal hernia. No hilar or mediastinal adenopathy. Lungs/Pleura: No pleural effusion. No pneumothorax. No focal airspace disease. Upper Abdomen:  No acute findings. Musculoskeletal: 12 rib-bearing thoracic segments. Compression deformities of T5, T6, T8, and T10, age indeterminate. Marked thoracic kyphosis. Manubrial fracture of indeterminate acuity. Review of the MIP images confirms the above findings. IMPRESSION: 1. Negative for acute PE or thoracic aortic dissection. 2. Large hiatal hernia. 3. Sternal fracture, uncertain acuity 4. Multiple thoracic compression fracture deformities, age indeterminate 5. Coronary and Aortic Atherosclerosis (ICD10-170.0). Electronically Signed   By: Corlis Leak  Hassell M.D.   On: 09/06/2018 16:33   Dg Chest Port 1 View  Result Date: 09/06/2018 CLINICAL DATA:  Central chest pain for the past day. History of COPD. EXAM: PORTABLE CHEST 1 VIEW COMPARISON:  None. FINDINGS: The heart size and mediastinal contours are within normal limits. Atherosclerotic calcification of the aortic arch. Normal pulmonary vascularity. Mildly coarsened interstitial markings are likely chronic and related to patient's underlying COPD. No focal consolidation, pleural effusion, or pneumothorax. No acute osseous abnormality. IMPRESSION: 1. No active disease. 2.  Aortic atherosclerosis (ICD10-I70.0). Electronically Signed   By: Obie DredgeWilliam T Derry M.D.   On: 09/06/2018 15:17    Procedures Procedures (including critical care time)  Medications Ordered in ED  Medications - No data to display   Initial Impression / Assessment and Plan / ED Course  I have reviewed the triage vital signs and the nursing notes.  Pertinent labs & imaging results that were available during my care of the patient were reviewed by me and considered in my medical decision making (see chart for details).  Clinical Course as of Sep 05 1845  Thu Sep 06, 2018  [id number] 85 year old female with no prior cardiac history here after her husband noticed her clutching her chest.  Differential diagnosis includes ACS, pneumonia, pneumothorax, GERD, musculoskeletal.  Patient has dementia and currently denies any complaints.  Will check some screening labs EKG chest x-ray including a troponin.   [MB]  1445 Patient's husband is here now and he related events happened earlier today about her clutching her chest and saying she had pain.  He said he is never seen her do that before and is not known to have any heart troubles.  He is hoping that with further cardiac work-up is negative that she would be able to be discharged.   [MB]  1637 Patient's checks x-ray had commented upon some aortic calcification with no priors available to compare with.  I put her in for a CT chest which did not show a PE or dissection but did comment upon some hiatal hernia.  Possibly a cause of her symptoms.  I reviewed this with her husband and she is going to get a delta troponin.  I think if this is negative she could likely be discharged to follow-up with her PCP.   [MB]    Clinical Course User Index [MB] Terrilee FilesButler, Marliyah Reid C, MD   Lisa Henry was evaluated in Emergency Department on 09/06/2018 for the symptoms described in the history of present illness. She was evaluated in the context of the global COVID-19 pandemic, which necessitated consideration that the patient might be at risk for infection with the SARS-CoV-2 virus that causes COVID-19. Institutional protocols and algorithms that pertain to the evaluation of  patients at risk for COVID-19 are in a state of rapid change based on information released by regulatory bodies including the CDC and federal and state organizations. These policies and algorithms were followed during the patient's care in the ED.      Final Clinical Impressions(s) / ED Diagnoses   Final diagnoses:  Nonspecific chest pain  Hiatal hernia    ED Discharge Orders    None       Terrilee FilesButler, Omid Deardorff C, MD 09/06/18 [id number]

## 2018-09-06 NOTE — Discharge Instructions (Signed)
You were seen in the emergency department for chest pain.  You had blood work EKG and a CAT scan of your chest that did not show any signs of any heart attack.  Your CAT scan did show a hiatal hernia which may be a cause of your symptoms.  You should use Maalox between meals and at bedtime.  Please follow-up with your doctor and return if any worsening symptoms.

## 2018-09-06 NOTE — ED Triage Notes (Signed)
[facility] reports that the patient's husband called because patient was clutching her chest. She has a hx of dementia and parkinson's disease.He denied diaphoresis and N/V/D.

## 2018-09-10 LAB — CBC WITH DIFFERENTIAL/PLATELET
Absolute Monocytes: 523 cells/uL (ref 200–950)
Basophils Absolute: 60 cells/uL (ref 0–200)
Basophils Relative: 0.9 %
Eosinophils Absolute: 147 cells/uL (ref 15–500)
Eosinophils Relative: 2.2 %
HCT: 43.1 % (ref 35.0–45.0)
Hemoglobin: 14.5 g/dL (ref 11.7–15.5)
Lymphs Abs: 2104 cells/uL (ref 850–3900)
MCH: 31.1 pg (ref 27.0–33.0)
MCHC: 33.6 g/dL (ref 32.0–36.0)
MCV: 92.5 fL (ref 80.0–100.0)
MPV: 11.5 fL (ref 7.5–12.5)
Monocytes Relative: 7.8 %
Neutro Abs: 3866 cells/uL (ref 1500–7800)
Neutrophils Relative %: 57.7 %
Platelets: 233 10*3/uL (ref 140–400)
RBC: 4.66 10*6/uL (ref 3.80–5.10)
RDW: 12.9 % (ref 11.0–15.0)
Total Lymphocyte: 31.4 %
WBC: 6.7 10*3/uL (ref 3.8–10.8)

## 2018-09-10 LAB — LIPID PANEL
Cholesterol: 169 mg/dL (ref <200)
HDL: 46 mg/dL — ABNORMAL LOW (ref >=50)
LDL Cholesterol (Calc): 104 mg/dL (calc) — ABNORMAL HIGH
Non-HDL Cholesterol (Calc): 123 mg/dL (calc) (ref <130)
Total CHOL/HDL Ratio: 3.7 (calc) (ref <5.0)
Triglycerides: 94 mg/dL (ref <150)

## 2018-09-10 LAB — COMPLETE METABOLIC PANEL WITH GFR
AG Ratio: 1.3 (calc) (ref 1.0–2.5)
ALT: 7 U/L (ref 6–29)
AST: 19 U/L (ref 10–35)
Albumin: 4 g/dL (ref 3.6–5.1)
Alkaline phosphatase (APISO): 47 U/L (ref 37–153)
BUN/Creatinine Ratio: 30 (calc) — ABNORMAL HIGH (ref 6–22)
BUN: 32 mg/dL — ABNORMAL HIGH (ref 7–25)
CO2: 25 mmol/L (ref 20–32)
Calcium: 9.4 mg/dL (ref 8.6–10.4)
Chloride: 106 mmol/L (ref 98–110)
Creat: 1.07 mg/dL — ABNORMAL HIGH (ref 0.60–0.88)
GFR, Est African American: 56 mL/min/{1.73_m2} — ABNORMAL LOW (ref >=60)
GFR, Est Non African American: 48 mL/min/{1.73_m2} — ABNORMAL LOW (ref >=60)
Globulin: 3.1 g/dL (calc) (ref 1.9–3.7)
Glucose, Bld: 77 mg/dL (ref 65–99)
Potassium: 4.5 mmol/L (ref 3.5–5.3)
Sodium: 141 mmol/L (ref 135–146)
Total Bilirubin: 0.4 mg/dL (ref 0.2–1.2)
Total Protein: 7.1 g/dL (ref 6.1–8.1)

## 2018-09-10 LAB — TSH: TSH: 1.51 mIU/L (ref 0.40–4.50)

## 2018-09-10 LAB — VITAMIN D 1,25 DIHYDROXY
Vitamin D 1, 25 (OH)2 Total: 33 pg/mL (ref 18–72)
Vitamin D2 1, 25 (OH)2: 12 pg/mL
Vitamin D3 1, 25 (OH)2: 21 pg/mL

## 2018-09-12 ENCOUNTER — Encounter: Payer: Medicare Other | Admitting: Internal Medicine

## 2018-09-12 ENCOUNTER — Other Ambulatory Visit: Payer: Self-pay | Admitting: Internal Medicine

## 2018-09-12 ENCOUNTER — Encounter: Payer: Self-pay | Admitting: Internal Medicine

## 2018-09-12 ENCOUNTER — Other Ambulatory Visit: Payer: Self-pay

## 2018-09-12 DIAGNOSIS — N183 Chronic kidney disease, stage 3 unspecified: Secondary | ICD-10-CM

## 2018-09-14 DIAGNOSIS — L84 Corns and callosities: Secondary | ICD-10-CM | POA: Diagnosis not present

## 2018-09-14 DIAGNOSIS — B351 Tinea unguium: Secondary | ICD-10-CM | POA: Diagnosis not present

## 2018-09-14 DIAGNOSIS — M79672 Pain in left foot: Secondary | ICD-10-CM | POA: Diagnosis not present

## 2018-09-14 DIAGNOSIS — M79671 Pain in right foot: Secondary | ICD-10-CM | POA: Diagnosis not present

## 2018-09-15 NOTE — Progress Notes (Signed)
A user error has taken place.

## 2018-09-17 ENCOUNTER — Other Ambulatory Visit: Payer: Self-pay

## 2018-09-17 ENCOUNTER — Other Ambulatory Visit: Payer: Medicare Other

## 2018-09-17 DIAGNOSIS — N183 Chronic kidney disease, stage 3 unspecified: Secondary | ICD-10-CM

## 2018-09-17 LAB — BASIC METABOLIC PANEL WITH GFR
BUN/Creatinine Ratio: 39 (calc) — ABNORMAL HIGH (ref 6–22)
BUN: 39 mg/dL — ABNORMAL HIGH (ref 7–25)
CO2: 31 mmol/L (ref 20–32)
Calcium: 9.7 mg/dL (ref 8.6–10.4)
Chloride: 108 mmol/L (ref 98–110)
Creat: 0.99 mg/dL — ABNORMAL HIGH (ref 0.60–0.88)
GFR, Est African American: 61 mL/min/{1.73_m2} (ref >=60)
GFR, Est Non African American: 53 mL/min/{1.73_m2} — ABNORMAL LOW (ref >=60)
Glucose, Bld: 87 mg/dL (ref 65–99)
Potassium: 4.6 mmol/L (ref 3.5–5.3)
Sodium: 144 mmol/L (ref 135–146)

## 2018-09-17 LAB — EXTRA LAV TOP TUBE

## 2018-09-26 ENCOUNTER — Other Ambulatory Visit: Payer: Self-pay

## 2018-09-26 ENCOUNTER — Encounter: Payer: Self-pay | Admitting: Internal Medicine

## 2018-09-26 ENCOUNTER — Non-Acute Institutional Stay: Payer: Medicare Other | Admitting: Internal Medicine

## 2018-09-26 VITALS — BP 110/70 | HR 60 | Temp 98.4°F | Ht 64.0 in | Wt 92.2 lb

## 2018-09-26 DIAGNOSIS — R1013 Epigastric pain: Secondary | ICD-10-CM | POA: Diagnosis not present

## 2018-09-26 DIAGNOSIS — E039 Hypothyroidism, unspecified: Secondary | ICD-10-CM

## 2018-09-26 DIAGNOSIS — G20 Parkinson's disease: Secondary | ICD-10-CM | POA: Diagnosis not present

## 2018-09-26 DIAGNOSIS — N390 Urinary tract infection, site not specified: Secondary | ICD-10-CM

## 2018-09-27 NOTE — Progress Notes (Signed)
Location:      Place of Service:     Provider:   Code Status:  Goals of Care:  Advanced Directives 09/26/2018  Does Patient Have a Medical Advance Directive? Yes  Type of Advance Directive -  Does patient want to make changes to medical advance directive? No - Patient declined  Copy of Healthcare Power of Attorney in Chart? -  Would patient like information on creating a medical advance directive? -  Pre-existing out of facility DNR order (yellow form or pink MOST form) -     Chief Complaint  Patient presents with  . Medical Management of Chronic Issues    Follow up appointment and lab work    HPI: Patient is a 85 y.o. female seen today for medical management of chronic diseases.      Patient has h/o Progressive Parkinson Disease, With Dementia, Recurrent UTI, Hypothyroidism Patient taken to ED few weeks ago by her husband as he noticed that she was clutching her chest.  The work-up in the ED was completely negative.  She did have a CT scan of her chest done which showed large hiatal hernia.  He has been giving her Pepcid [facility] but says that she is still continues to hold herself epigastric area at night.  Seem like she is in any acute distress her weight is up her appetite is good she has not had any nausea or vomiting  She is Wheelchair Dependent and dependent on her husband for her ADLS. She does not talk most of the time and does not follow commands. [place] and goes [facility]  Past Medical History:  Diagnosis Date  . COPD (chronic obstructive pulmonary disease) (HCC)   . Dysarthria   . Falls   . Gastroenteritis   . Hypothyroid   . Incontinence   . 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 Rectal prolapse   . Recurrent UTI     Past Surgical History:  Procedure Laterality Date  . CATARACT EXTRACTION, BILATERAL    . CHOLECYSTECTOMY, LAPAROSCOPIC  2015   Dr. Arville Careepeppi   . FEMUR FRACTURE SURGERY    .  REPAIR OF RECTAL PROLAPSE    . TONSILLECTOMY AND ADENOIDECTOMY      No Known Allergies  Outpatient Encounter Medications as of 09/26/2018  Medication Sig  . carbidopa-levodopa (SINEMET CR) 50-200 MG tablet Take 1 tablet by mouth at bedtime.  . carbidopa-levodopa (SINEMET IR) 25-100 MG tablet Take 1 tablet by mouth 3 (three) times daily.  Marland Kitchen. ENSURE (ENSURE) Take 237 mLs by mouth. One daily with lunch  . levothyroxine (SYNTHROID) 75 MCG tablet Take 1 tablet (75 mcg total) by mouth daily.  . QUEtiapine (SEROQUEL) 25 MG tablet Take 1 tablet (25 mg total) by mouth at bedtime.  Marland Kitchen. trimethoprim (TRIMPEX) 100 MG tablet Take 100 mg by mouth daily.   No facility-administered encounter medications on file as of 09/26/2018.     Review of Systems:  Review of Systems  Unable to perform ROS: Dementia    Health Maintenance  Topic Date Due  . DEXA SCAN  01/30/2000  . TETANUS/TDAP  12/07/2018 (Originally 01/29/1952)  . INFLUENZA VACCINE  10/20/2018  . PNA vac Low Risk Adult (2 of 2 - PPSV23) 01/23/2019    Physical Exam: Vitals:   09/26/18 1545  BP: 110/70  Pulse: 60  Temp: 98.4 F (36.9 C)  TempSrc: Oral  SpO2: 91%  Weight:  92 lb 3.2 oz (41.8 kg)  Height: 5\' 4"  (1.626 m)   Body mass index is 15.83 kg/m. Physical Exam Vitals signs reviewed.  Constitutional:      Appearance: Normal appearance.  HENT:     Head: Normocephalic.     Nose: Nose normal.     Mouth/Throat:     Mouth: Mucous membranes are moist.     Pharynx: Oropharynx is clear.  Eyes:     Pupils: Pupils are equal, round, and reactive to light.  Neck:     Musculoskeletal: Neck supple.  Cardiovascular:     Rate and Rhythm: Normal rate and regular rhythm.     Pulses: Normal pulses.     Heart sounds: Normal heart sounds.  Pulmonary:     Effort: Pulmonary effort is normal.     Breath sounds: Normal breath sounds.  Abdominal:     General: Abdomen is flat. Bowel sounds are normal.     Comments: Patient would not lie down  for me to do proper exam But her stomach was soft and no tenderness  Musculoskeletal:        General: No swelling.  Skin:    General: Skin is warm and dry.  Neurological:     General: No focal deficit present.     Mental Status: She is alert.     Comments: Has Rigidity in all extremities Not following Commands today  Psychiatric:        Mood and Affect: Mood normal.        Thought Content: Thought content normal.     Labs reviewed: Basic Metabolic Panel: Recent Labs    03/26/18 0000 09/06/18 0000 09/06/18 1342 09/17/18 0803  NA 141 141 143 144  K 4.9 4.5 5.4* 4.6  CL 105 106 110 108  CO2 28 25 23 31   GLUCOSE 89 77 91 87  BUN 33* 32* 30* 39*  CREATININE 0.96* 1.07* 1.06* 0.99*  CALCIUM 9.5 9.4 9.5 9.7  TSH 1.61 1.51  --   --    Liver Function Tests: Recent Labs    11/27/17 1000 03/26/18 0000 09/06/18 0000  AST 15 23 19   ALT 3* 4* 7  BILITOT 0.4 0.5 0.4  PROT 6.8 7.0 7.1   No results for input(s): LIPASE, AMYLASE in the last 8760 hours. No results for input(s): AMMONIA in the last 8760 hours. CBC: Recent Labs    11/27/17 1000 09/06/18 0000 09/06/18 1342  WBC 7.9 6.7 7.9  NEUTROABS  --  3,866  --   HGB 13.9 14.5 14.1  HCT 41.4 43.1 43.8  MCV 89.0 92.5 94.8  PLT 272 233 205   Lipid Panel: Recent Labs    11/27/17 1000 09/06/18 0000  CHOL 177 169  HDL 46* 46*  LDLCALC 111* 409104*  TRIG 95 94  CHOLHDL 3.8 3.7   Lab Results  Component Value Date   HGBA1C 6.2 12/22/2015    Procedures since last visit: Ct Angio Chest Pe W/cm &/or Wo Cm  Result Date: 09/06/2018 CLINICAL DATA:  Chest pain.  Dementia EXAM: CT ANGIOGRAPHY CHEST WITH CONTRAST TECHNIQUE: Multidetector CT imaging of the chest was performed using the standard protocol during bolus administration of intravenous contrast. Multiplanar CT image reconstructions and MIPs were obtained to evaluate the vascular anatomy. CONTRAST:  75mL OMNIPAQUE IOHEXOL 350 MG/ML SOLN COMPARISON:  None. FINDINGS:  Cardiovascular: Heart size normal. No pericardial effusion. Satisfactory opacification of pulmonary arteries noted, and there is no evidence of pulmonary emboli. Scattered coronary calcifications.  Adequate contrast opacification of the thoracic aorta with no evidence of dissection, aneurysm, or stenosis. There is classic 3-vessel brachiocephalic arch anatomy without proximal stenosis. Calcified plaque in the arch and descending thoracic segment. Visualized portions of abdominal aorta unremarkable. Mediastinum/Nodes: Large hiatal hernia. No hilar or mediastinal adenopathy. Lungs/Pleura: No pleural effusion. No pneumothorax. No focal airspace disease. Upper Abdomen: No acute findings. Musculoskeletal: 12 rib-bearing thoracic segments. Compression deformities of T5, T6, T8, and T10, age indeterminate. Marked thoracic kyphosis. Manubrial fracture of indeterminate acuity. Review of the MIP images confirms the above findings. IMPRESSION: 1. Negative for acute PE or thoracic aortic dissection. 2. Large hiatal hernia. 3. Sternal fracture, uncertain acuity 4. Multiple thoracic compression fracture deformities, age indeterminate 5. Coronary and Aortic Atherosclerosis (ICD10-170.0). Electronically Signed   By: Lucrezia Europe M.D.   On: 09/06/2018 16:33   Dg Chest Port 1 View  Result Date: 09/06/2018 CLINICAL DATA:  Central chest pain for the past day. History of COPD. EXAM: PORTABLE CHEST 1 VIEW COMPARISON:  None. FINDINGS: The heart size and mediastinal contours are within normal limits. Atherosclerotic calcification of the aortic arch. Normal pulmonary vascularity. Mildly coarsened interstitial markings are likely chronic and related to patient's underlying COPD. No focal consolidation, pleural effusion, or pneumothorax. No acute osseous abnormality. IMPRESSION: 1. No active disease. 2.  Aortic atherosclerosis (ICD10-I70.0). Electronically Signed   By: Titus Dubin M.D.   On: 09/06/2018 15:17    Assessment/Plan  Epigastric Pain Patient did have Large Hiatal Hernia in CT scan of chest done in ED. Will treat her for that with Prilosec 20 mg QD If she still looks in distress then ? Possible Ct scan of her Abdomen Though she is not candidate for aggressive work up Follow up in 4 weeks  IParkinson disease On Sinemet Wheelchair dependent Follows with neurology  Dementia Behavior stable on Seroquel at night  CKD (chronic kidney disease) stage 3, GFR 30-59 ml/min Creat Stable.  Hypothyroid TSH was normal On Levothyroxine Recurrent UTI Continue on trimethoprim VIT D Def Level was normal   Labs/tests ordered:  * No order type specified * Next appt:  10/24/2018   Total time spent in this patient care encounter was  25_  minutes; greater than 50% of the visit spent counseling patient husband and staff, reviewing records , Labs and coordinating care for problems addressed at this encounter.

## 2018-10-10 DIAGNOSIS — N3946 Mixed incontinence: Secondary | ICD-10-CM | POA: Diagnosis not present

## 2018-10-24 ENCOUNTER — Non-Acute Institutional Stay: Payer: Medicare Other | Admitting: Internal Medicine

## 2018-10-24 ENCOUNTER — Encounter: Payer: Self-pay | Admitting: Internal Medicine

## 2018-10-24 ENCOUNTER — Other Ambulatory Visit: Payer: Self-pay

## 2018-10-24 VITALS — BP 100/72 | HR 68 | Temp 98.1°F | Ht 64.0 in | Wt 88.6 lb

## 2018-10-24 DIAGNOSIS — R1013 Epigastric pain: Secondary | ICD-10-CM

## 2018-10-24 DIAGNOSIS — R634 Abnormal weight loss: Secondary | ICD-10-CM

## 2018-10-24 NOTE — Progress Notes (Signed)
Location: [facility]   Place of Service:  Clinic (12)  Provider:   Code Status:  Goals of Care:  Advanced Directives 10/24/2018  Does Patient Have a Medical Advance Directive? No  Type of Advance Directive -  Does patient want to make changes to medical advance directive? No - Patient declined  Copy of Healthcare Power of Attorney in Chart? -  Would patient like information on creating a medical advance directive? No - Patient declined  Pre-existing out of facility DNR order (yellow form or pink MOST form) -     Chief Complaint  Patient presents with  . Medical Management of Chronic Issues    4 week follow up     HPI: Patient is a 85 y.o. female seen today for an acute visit for Follow up of Epigastric Pain  Patient has h/o Progressive Parkinson Disease, With Dementia, Recurrent UTI, Hypothyroidism  She is total care. Patient taken to ED few months ago by her husband as he noticed that she was clutching her chest.  The work-up in the ED was completely negative.  She did have a CT scan of her chest done which showed large hiatal hernia.  I had started patient on over-the-counter omeprazole.  This was her follow-up visit . Patient is unable to give any history.  But per her husband she has been feeling good and has not had any more episodes of discomfort.  These episodes mostly happened at night when she was getting ready to sleep.  He has not stopped the Prilosec. Patient does have decreased appetite. She has lost few pounds again.  Patient states that she gets very tired in the evening and does not eat her supper properly. No other , Complaints She is Wheelchair Dependent and dependent on her husband for her ADLS. She does not talk most of the time and does not follow commands. [place] and goes [facility] times a week  Past Medical History:  Diagnosis Date  . COPD (chronic obstructive pulmonary disease) (HCC)   . Dysarthria   . Falls    . Gastroenteritis   . Hypothyroid   . Incontinence   . 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 Rectal prolapse   . Recurrent UTI     Past Surgical History:  Procedure Laterality Date  . CATARACT EXTRACTION, BILATERAL    . CHOLECYSTECTOMY, LAPAROSCOPIC  2015   Dr. Arville Careepeppi   . FEMUR FRACTURE SURGERY    . REPAIR OF RECTAL PROLAPSE    . TONSILLECTOMY AND ADENOIDECTOMY      No Known Allergies  Outpatient Encounter Medications as of 10/24/2018  Medication Sig  . carbidopa-levodopa (SINEMET CR) 50-200 MG tablet Take 1 tablet by mouth at bedtime.  . carbidopa-levodopa (SINEMET IR) 25-100 MG tablet Take 1 tablet by mouth 3 (three) times daily.  Marland Kitchen. ENSURE (ENSURE) Take 237 mLs by mouth. One daily with lunch  . levothyroxine (SYNTHROID) 75 MCG tablet Take 1 tablet (75 mcg total) by mouth daily.  . QUEtiapine (SEROQUEL) 25 MG tablet Take 1 tablet (25 mg total) by mouth at bedtime.  Marland Kitchen. trimethoprim (TRIMPEX) 100 MG tablet Take 100 mg by mouth daily.   No facility-administered encounter medications on file as of 10/24/2018.     Review of Systems:  Review of Systems  Unable to perform ROS: Dementia    Health Maintenance  Topic Date Due  . DEXA SCAN  01/30/2000  .  INFLUENZA VACCINE  10/20/2018  . TETANUS/TDAP  12/07/2018 (Originally 01/29/1952)  . PNA vac Low Risk Adult (2 of 2 - PPSV23) 01/23/2019    Physical Exam: Vitals:   10/24/18 1302  BP: 100/72  Pulse: 68  Temp: 98.1 F (36.7 C)  TempSrc: Oral  SpO2: 91%  Weight: 88 lb 9.6 oz (40.2 kg)  Height: 5\' 4"  (1.626 m)   Body mass index is 15.21 kg/m. Physical Exam Vitals signs reviewed.  HENT:     Head: Normocephalic.     Nose: Nose normal.     Mouth/Throat:     Mouth: Mucous membranes are moist.     Pharynx: Oropharynx is clear.  Eyes:     Pupils: Pupils are equal, round, and reactive to light.  Neck:     Musculoskeletal: Neck supple.  Cardiovascular:     Rate and Rhythm: Normal rate and  regular rhythm.     Pulses: Normal pulses.  Pulmonary:     Effort: Pulmonary effort is normal.     Breath sounds: Normal breath sounds.  Abdominal:     General: Abdomen is flat. Bowel sounds are normal. There is no distension.     Palpations: Abdomen is soft.     Tenderness: There is no abdominal tenderness. There is no guarding.  Musculoskeletal:        General: No swelling.  Skin:    General: Skin is warm.  Neurological:     Mental Status: She is alert.     Comments: Patient does not respond     Labs reviewed: Basic Metabolic Panel: Recent Labs    03/26/18 0000 09/06/18 0000 09/06/18 1342 09/17/18 0803  NA 141 141 143 144  K 4.9 4.5 5.4* 4.6  CL 105 106 110 108  CO2 28 25 23 31   GLUCOSE 89 77 91 87  BUN 33* 32* 30* 39*  CREATININE 0.96* 1.07* 1.06* 0.99*  CALCIUM 9.5 9.4 9.5 9.7  TSH 1.61 1.51  --   --    Liver Function Tests: Recent Labs    11/27/17 1000 03/26/18 0000 09/06/18 0000  AST 15 23 19   ALT 3* 4* 7  BILITOT 0.4 0.5 0.4  PROT 6.8 7.0 7.1   No results for input(s): LIPASE, AMYLASE in the last 8760 hours. No results for input(s): AMMONIA in the last 8760 hours. CBC: Recent Labs    11/27/17 1000 09/06/18 0000 09/06/18 1342  WBC 7.9 6.7 7.9  NEUTROABS  --  3,866  --   HGB 13.9 14.5 14.1  HCT 41.4 43.1 43.8  MCV 89.0 92.5 94.8  PLT 272 233 205   Lipid Panel: Recent Labs    11/27/17 1000 09/06/18 0000  CHOL 177 169  HDL 46* 46*  LDLCALC 111* 104*  TRIG 95 94  CHOLHDL 3.8 3.7   Lab Results  Component Value Date   HGBA1C 6.2 12/22/2015    Procedures since last visit: No results found.  Assessment/Plan  Epigastric pain - Plan:  She did well on Prilosec Pain resolved and he is not using it anymore Not tender on Exam today  Weight loss - Plan:  Weight slightly down again Discussed with the husband and he is going to talk to the kitchen again to see their supper early Follow up in 4 weeks  Other Issues are stable   Parkinson disease On Sinemet Wheelchair dependent Follows with neurology  Dementia Behavior stable on Seroquel at night  CKD (chronic kidney disease) stage 3, GFR 30-59 ml/min Creat  Stable.  Hypothyroid TSH was normal On Levothyroxine Recurrent UTI Continue on trimethoprim VIT D Def Level was normal    Labs/tests ordered:  * No order type specified * Next appt:  Visit date not found

## 2018-10-25 DIAGNOSIS — S41111A Laceration without foreign body of right upper arm, initial encounter: Secondary | ICD-10-CM | POA: Diagnosis not present

## 2018-10-28 DIAGNOSIS — F0390 Unspecified dementia without behavioral disturbance: Secondary | ICD-10-CM | POA: Diagnosis not present

## 2018-10-28 DIAGNOSIS — S41111D Laceration without foreign body of right upper arm, subsequent encounter: Secondary | ICD-10-CM | POA: Diagnosis not present

## 2018-11-19 DIAGNOSIS — H35363 Drusen (degenerative) of macula, bilateral: Secondary | ICD-10-CM | POA: Diagnosis not present

## 2018-11-21 ENCOUNTER — Encounter: Payer: Self-pay | Admitting: Internal Medicine

## 2018-11-21 ENCOUNTER — Other Ambulatory Visit: Payer: Self-pay

## 2018-11-21 ENCOUNTER — Non-Acute Institutional Stay: Payer: Medicare Other | Admitting: Internal Medicine

## 2018-11-21 VITALS — BP 112/62 | HR 76 | Temp 97.2°F | Resp 18 | Ht 64.0 in | Wt 88.8 lb

## 2018-11-21 DIAGNOSIS — E039 Hypothyroidism, unspecified: Secondary | ICD-10-CM | POA: Diagnosis not present

## 2018-11-21 DIAGNOSIS — N183 Chronic kidney disease, stage 3 unspecified: Secondary | ICD-10-CM

## 2018-11-21 DIAGNOSIS — G20 Parkinson's disease: Secondary | ICD-10-CM

## 2018-11-21 DIAGNOSIS — F0281 Dementia in other diseases classified elsewhere with behavioral disturbance: Secondary | ICD-10-CM

## 2018-11-21 DIAGNOSIS — R634 Abnormal weight loss: Secondary | ICD-10-CM | POA: Diagnosis not present

## 2018-11-21 NOTE — Addendum Note (Signed)
Addended by: Georgina Snell on: 11/21/2018 10:00 PM   Modules accepted: Orders

## 2018-11-21 NOTE — Progress Notes (Signed)
Location: [place] of Service:  Clinic (12)  Provider:   Code Status:  Goals of Care:  Advanced Directives 10/24/2018  Does Patient Have a Medical Advance Directive? No  Type of Advance Directive -  Does patient want to make changes to medical advance directive? No - Patient declined  Copy of [place] in Chart? -  Would patient like information on creating a medical advance directive? No - Patient declined  Pre-existing out of facility DNR order (yellow form or pink MOST form) -     Chief Complaint  Patient presents with  . Medical Management of Chronic Issues    4 wk f/u    HPI: Patient is a 85 y.o. female seen today for an acute visit for Weight loss Patient has h/o Parkinson Disease, With Dementia, Recurrent UTI, Hypothyroidism And Hiatal Hernia  Parkinson Disease Patient comes with her Husband. She is Wheelchair bound can walk with Mod assist by her husband and completely dependent on him for ADLS.She does answer in one word. Follows some simple commands. She though can feed herself. [place] live in [place]. She does go to Memory clinic in [place] 3 /week. Weight Loss Patient has lost few pounds on her last visit.  Husband also states that her appetite was decreased as the food was coming from the kitchen late to their apartment. He has changed  she can have a better meal little earlier in the day to increase her calories Today her weight is slightly up.  Hiatal Hernia  She was recently diagnosed with hiatal hernia with gastritis.  Her pain is better OTC Prilosec.  He has not noticed any more issues with abdominal pain. Right Hand Bruise Patient did sustain small Bruise in her Right hand due to a side table in their apartment. He took her to either urgent care where she got tetanus shot.    Past Medical History:  Diagnosis Date  . COPD (chronic obstructive pulmonary disease) ([place])   . Dysarthria   . Falls   . Gastroenteritis   .  Hypothyroid   . Incontinence   . Parkinson disease ([place])    with dementia and hallucinations  . Rectal prolapse   . Recurrent UTI     Past Surgical History:  Procedure Laterality Date  . CATARACT EXTRACTION, BILATERAL    . CHOLECYSTECTOMY, LAPAROSCOPIC  2015   Dr. Dayton Martes   . FEMUR FRACTURE SURGERY    . REPAIR OF RECTAL PROLAPSE    . TONSILLECTOMY AND ADENOIDECTOMY      No Known Allergies  Outpatient Encounter Medications as of 11/21/2018  Medication Sig  . carbidopa-levodopa (SINEMET CR) 50-200 MG tablet Take 1 tablet by mouth at bedtime.  . carbidopa-levodopa (SINEMET IR) 25-100 MG tablet Take 1 tablet by mouth 3 (three) times daily.  Marland Kitchen ENSURE (ENSURE) Take 237 mLs by mouth. One daily with lunch  . levothyroxine (SYNTHROID) 75 MCG tablet Take 1 tablet (75 mcg total) by mouth daily.  . QUEtiapine (SEROQUEL) 25 MG tablet Take 1 tablet (25 mg total) by mouth at bedtime.  Marland Kitchen trimethoprim (TRIMPEX) 100 MG tablet Take 100 mg by mouth daily.   No facility-administered encounter medications on file as of 11/21/2018.     Review of Systems:  Review of Systems  Unable to perform ROS: Dementia    Health Maintenance  Topic Date Due  . DEXA SCAN  01/30/2000  . INFLUENZA VACCINE  10/20/2018  . TETANUS/TDAP  12/07/2018 (Originally 01/29/1952)  .  PNA vac Low Risk Adult (2 of 2 - PPSV23) 01/23/2019    Physical Exam: Vitals:   11/21/18 1410  BP: 112/62  Pulse: 76  Resp: 18  Temp: (!) 97.2 F (36.2 C)  Weight: 88 lb 12.8 oz (40.3 kg)  Height: 5\' 4"  (1.626 m)   Body mass index is 15.24 kg/m. Physical Exam  .  HENT:  Head: Normocephalic.  Mouth/Throat: Oropharynx is clear and moist.  Eyes: Pupils are equal, round, and reactive to light.  Neck: Neck supple.  Cardiovascular: Normal rate and normal heart sounds.  No murmur heard. Pulmonary/Chest: Effort normal and breath sounds normal. No respiratory distress. No wheezes. She has no rales.  Abdominal: Soft. Bowel sounds are  normal. No distension. There is no tenderness. There is no rebound.  Musculoskeletal: No edema.  Lymphadenopathy: none Neurological: . Has Rigidity in all extremities Not following Commands  Skin: Skin is warm and dry.  Psychiatric: Normal mood and affect. Behavior is normal. Thought content normal.    Labs reviewed: Basic Metabolic Panel: Recent Labs    03/26/18 0000 09/06/18 0000 09/06/18 1342 09/17/18 0803  NA 141 141 143 144  K 4.9 4.5 5.4* 4.6  CL 105 106 110 108  CO2 28 25 23 31   GLUCOSE 89 77 91 87  BUN 33* 32* 30* 39*  CREATININE 0.96* 1.07* 1.06* 0.99*  CALCIUM 9.5 9.4 9.5 9.7  TSH 1.61 1.51  --   --    Liver Function Tests: Recent Labs    11/27/17 1000 03/26/18 0000 09/06/18 0000  AST 15 23 19   ALT 3* 4* 7  BILITOT 0.4 0.5 0.4  PROT 6.8 7.0 7.1   No results for input(s): LIPASE, AMYLASE in the last 8760 hours. No results for input(s): AMMONIA in the last 8760 hours. CBC: Recent Labs    11/27/17 1000 09/06/18 0000 09/06/18 1342  WBC 7.9 6.7 7.9  NEUTROABS  --  3,866  --   HGB 13.9 14.5 14.1  HCT 41.4 43.1 43.8  MCV 89.0 92.5 94.8  PLT 272 233 205   Lipid Panel: Recent Labs    11/27/17 1000 09/06/18 0000  CHOL 177 169  HDL 46* 46*  LDLCALC 111* 098104*  TRIG 95 94  CHOLHDL 3.8 3.7   Lab Results  Component Value Date   HGBA1C 6.2 12/22/2015    Procedures since last visit: No results found.  Assessment/Plan Weight Loss Her weight is up. Husband is following her Calorie Intake more regularly. He had told me before that he wants to be conservative in any aggressive testing for her. We discussed again today about starting her on Remeron.  Husband wants to talk to the neurologist.  IParkinson disease On Sinemet. Not Mobile anymore . Mostly Wheelchair Bound and dependent for her ADL Follows with Neurology  Dementia Behavior stable on Seroquel at night  CKD (chronic kidney disease) stage 3, GFR 30-59 ml/min Creat Stable.  Hypothyroid TSH was normal On Levothyroxine Recurrent UTI Continue on trimethoprim VIT D Def She is not taking Vit D anymore Repeat Level is Normal Right Hand Bruise Healed now S/P Tetanus Shot   Labs/tests ordered:  * No order type specified * Next appt:  Visit date not found Total time spent in this patient care encounter was  _25  minutes; greater than 50% of the visit spent counseling patient and staff, reviewing records , Labs and coordinating care for problems addressed at this encounter.

## 2018-12-12 ENCOUNTER — Other Ambulatory Visit: Payer: Self-pay

## 2018-12-12 MED ORDER — QUETIAPINE FUMARATE 25 MG PO TABS: 25.0000 mg | ORAL_TABLET | Freq: Every day | ORAL | 2 refills | Status: AC

## 2018-12-12 NOTE — Telephone Encounter (Signed)
Requested Prescriptions   Pending Prescriptions Disp Refills  . QUEtiapine (SEROQUEL) 25 MG tablet 90 tablet 2    Sig: Take 1 tablet (25 mg total) by mouth at bedtime.   Rx last filled: 04/17/18 #90 2 refills  Pt last seen: 04/17/18 #90  refills  Follow up appt scheduled: 01/07/19

## 2019-01-03 NOTE — Progress Notes (Signed)
[place] was seen today in the movement disorders clinic for neurologic consultation at the request of Mast, Man X, NP.  The consultation is for the evaluation of PD.  Dx was apparently made on Oct 1,1995.  First sx was R leg tremor.  Husband states that carbidopa/levodopa and requip were started at same time and she did well with these meds.  Husband unsure of why requip was taken off (presumably memory change but he states it was years ago).  She is on carbidopa/levodopa 25/100, qid (mealtime and bedtime).  She is also on selegeline, 5 mg, 2 po q am.    No prior neuro records are available.    11/17/16 update:  Patient seen today in follow-up for her Parkinson's disease.  She is accompanied by her husband and daughter who supplements the history.  The patient is on carbidopa/levodopa 25/100, one tablet 3 times per day.  I changed her nighttime levodopa to carbidopa/levodopa 50/200.  I did this in hopes that it would help her 3 AM tremor.  Her husband states that this is still there.  She is still on selegiline 5 mg and takes 2 tablets in the morning.  In regards to hallucinations, she still has these.  She doesn't know that they aren't real.  More fearful of these.   She has illusions as well.  She has had no lightheadedness or near syncope.  No falls since our last visit.  No new medical issues have arisen.  She has some nightmares at night.    02/21/17 update: Patient seen today in follow-up for her Parkinson's disease with Parkinson's dementia.  She is accompanied by her husband and daughter who supplement the history.  She is on carbidopa/levodopa 25/100, 1 tablet 3 times per day and carbidopa/levodopa 50/200 at night.  We discontinued her selegiline last visit.  We added quetiapine.  She is on 25 mg at night.  She is less agitated, but still has hallucinations.  Her family did not want to add daytime quetiapine.  Sleeping is better.  Hallucinations are there most of the time but she isn't  agitated by them and haven't been disturbing to patient or family.  Some illusions as well.  Husband has very little respite care.  On Thursdays, he has someone for 2 hours/day 1 week and 3 hours the next day.  His daughter is concerned that that is not enough.  They also are not well equipped within the home, although they do live within senior living.  They do live independently.  07/04/17 update: Patient is seen today in follow-up for Parkinson's disease with associated Parkinson's disease dementia.  She is accompanied by her husband who supplements the history.  Patient remains on carbidopa/levodopa 25/100, 1 tablet 3 times per day as well as carbidopa/levodopa 50/200 at bedtime.  The patient does have hallucinations and associated agitation, so she is on quetiapine 25 mg at night and I told the patient's husband/daughter that they could use half a tablet during the day.  Today, they state that she isn't getting any in the day.  Mood has been "about the same."  Hallucinations are present but better than in the past.  They have met independently with my social worker since last visit.  Records are reviewed from that visit.  Patient was referred for home health services, but refused that and then when they met with my social worker asked for those services again.  When the home health therapist got to the home,  services were refused again.  Husband states that she likes to get on the floor and crawl on the floor.  No falls but generally doesn't walk without her husband.    01/03/18 update: Patient is seen today in follow-up for Parkinson's disease/Parkinson's disease dementia.  She is accompanied by her husband who supplements history.  Patient is on carbidopa/levodopa 25/100, 1 tablet 3 times per day and carbidopa/levodopa 50/200 at bedtime.  She is also on quetiapine 25 mg at bedtime.  She sleeps pretty well per husband.  She awakens at night but doesn't get up. she does doze in the daytime.  She continues to  have some agitation and some hallucinations, but hallucinations are much improved.  Records are reviewed since our last visit.  Her primary care physician put in speech therapy orders because of aspiration risk, and that was completed where she lives at a friend's home [place].  She is eating well per husband.   She is apraxic and when transferring he cannot get her to let go of WC.  01/07/19 update:  Virtual Visit via Video Note The purpose of this virtual visit is to provide medical care while limiting exposure to the novel coronavirus.    Consent was obtained for video visit:  Yes.   Answered questions that patient had about telehealth interaction:  Yes.   I discussed the limitations, risks, security and privacy concerns of performing an evaluation and management service by telemedicine. I also discussed with the patient that there may be a patient responsible charge related to this service. The patient expressed understanding and agreed to proceed.  Pt location: Home Physician Location: office Name of referring provider:  Blanchie Serve, MD I connected with [place] at patients initiation/request on 01/07/2019 at 11:15 AM EDT by video enabled telemedicine application and verified that I am speaking with the correct person using two identifiers. Pt MRN:  [id number] Pt DOB:  [birth year] Video Participants:  [place]; husband   Patient seen today in follow-up for Parkinson's disease with Parkinson's disease dementia.  She is with her husband who supplements history.  Patient is on carbidopa/levodopa 25/100, 1 tablet 3 times per day.  She is on carbidopa/levodopa 50/200 at bedtime.  She is also on quetiapine, 25 mg at bedtime. No hallucinations per husband.  Husband states that she is awake much of the night but she lays there quietly.  She naps much of the day. She does have some games on her table that toddlers may use to help them thread a shoe.  She also has a Sales promotion account executive.    Patient is  dependent on her husband for all activities of daily living.  She does feed herself.  She does walk within the apartment if her husband is holding onto her.   I have not seen her in about a year.  [facility] 7 hours each day.   I have reviewed medical records that are available to me.  PREVIOUS MEDICATIONS: Sinemet, Requip, Eldpryl and nuplazid  ALLERGIES:  No Known Allergies  CURRENT MEDICATIONS:  Outpatient Encounter Medications as of 01/07/2019  Medication Sig   carbidopa-levodopa (SINEMET CR) 50-200 MG tablet Take 1 tablet by mouth at bedtime.   carbidopa-levodopa (SINEMET IR) 25-100 MG tablet Take 1 tablet by mouth 3 (three) times daily.   ENSURE (ENSURE) Take 237 mLs by mouth. One daily with lunch   levothyroxine (SYNTHROID) 75 MCG tablet Take 1 tablet (75 mcg  total) by mouth daily.   QUEtiapine (SEROQUEL) 25 MG tablet Take 1 tablet (25 mg total) by mouth at bedtime.   trimethoprim (TRIMPEX) 100 MG tablet Take 100 mg by mouth daily.   No facility-administered encounter medications on file as of 01/07/2019.     PAST MEDICAL HISTORY:   Past Medical History:  Diagnosis Date   COPD (chronic obstructive pulmonary disease) ([employer])    Dysarthria    Falls    Gastroenteritis    Hypothyroid    Incontinence    Parkinson disease ([place])    with dementia and hallucinations   Rectal prolapse    Recurrent UTI     PAST SURGICAL HISTORY:   Past Surgical History:  Procedure Laterality Date   CATARACT EXTRACTION, BILATERAL     CHOLECYSTECTOMY, LAPAROSCOPIC  2015   Dr. Dayton Martes    FEMUR FRACTURE SURGERY     REPAIR OF RECTAL PROLAPSE     TONSILLECTOMY AND ADENOIDECTOMY      SOCIAL HISTORY:   Social History   Socioeconomic History   Marital status: Married    Spouse name: Not on file   Number of children: 4   Years of education: Not on file   Highest education level: Some college, no degree  Occupational History    Not on file  Social Needs   Financial resource strain: Not hard at all   Food insecurity    Worry: Never true    Inability: Never true   Transportation needs    Medical: No    Non-medical: No  Tobacco Use   Smoking status: Never Smoker   Smokeless tobacco: Never Used  Substance and Sexual Activity   Alcohol use: No   Drug use: No   Sexual activity: Not on file  Lifestyle   Physical activity    Days per week: 0 days    Minutes per session: 0 min   Stress: Not on file  Relationships   Social connections    Talks on phone: More than three times a week    Gets together: More than three times a week    Attends religious service: Never    Active member of club or organization: No    Attends meetings of clubs or organizations: Never    Relationship status: Married   Intimate partner violence    Fear of current or ex partner: No    Emotionally abused: No    Physically abused: No    Forced sexual activity: No  Other Topics Concern   Not on file  Social History Narrative   Moved to [facility] 05/27/2016   No tobacco use ever   . No alcohol use.    Does drinks/ eats things with caffeine.    Married in [place] lives in a 3 story apartment building with 2 other people and no pets.    Current or past profession; day care worker.    Does not exercise.    Has a living will, DNR and POA/HPOA.     FAMILY HISTORY:   Family Status  Relation Name Status   Mother Davy Pique Deceased   Father Lance Bosch Deceased   Brother Theodoro Doing Alive   Daughter [place] Alive   Son Sarles III Alive   Brother Lynann Bologna Deceased   Brother Francee Piccolo Deceased   Brother C. [facility]   Daughter Pleas Koch Alive   Son Chrissie Noa Alive    ROS:  Review of Systems  Unable to perform ROS: Dementia  PHYSICAL EXAMINATION:    VITALS:   Vitals:   01/07/19 0955  Weight: 88 lb (39.9 kg)  Height: '5\' 5"'  (1.651 m)    GEN:  The patient appears stated age and is in  NAD. HEENT:  Normocephalic, atraumatic.  The mucous membranes are very dry (can see this because her mouth is held open).    Neurological examination:  Orientation: The patient is alert but otherwise doesn't participate Cranial nerves: There is good facial symmetry.  Mouth is held wide open.  She does not speak today, with the exception of the say "no" when asked if she was going to participate with the exam.   Motor: Sitting up on the couch.  Does not participate with any aspect of the exam today.   Movement examination: Tone: There is normal tone in the upper and lower extremities today. Abnormal movements: There is no rest tremor today Coordination: Does not participate. Gait and Station: Not tested.  Labs:  Lab Results  Component Value Date   TSH 1.51 09/06/2018     Chemistry      Component Value Date/Time   NA 144 09/17/2018 0803   NA 140 12/22/2015   K 4.6 09/17/2018 0803   CL 108 09/17/2018 0803   CO2 31 09/17/2018 0803   BUN 39 (H) 09/17/2018 0803   BUN 25 (A) 12/22/2015   CREATININE 0.99 (H) 09/17/2018 0803   GLU 108 12/22/2015      Component Value Date/Time   CALCIUM 9.7 09/17/2018 0803   ALKPHOS 66 12/22/2015   AST 19 09/06/2018 0000   ALT 7 09/06/2018 0000   BILITOT 0.4 09/06/2018 0000       ASSESSMENT/PLAN:  1.  Idiopathic Parkinson's disease, advance.  The patient has tremor, bradykinesia, rigidity and mild postural instability.  -We discussed the diagnosis as well as pathophysiology of the disease.  We discussed treatment options as well as prognostic indicators.  Patient education was provided.  -continue carbidopa/levodopa 25/100 1 po tid  -continue carbidopa/levodopa 50/200 q hs  -Husband really does not wish to change anything.    2.  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    -Husband providing full 24/day care, with exception of the 2 days/week that she goes to the adult daycare  center at [place].  He will let me know if forms need to be filled out.  3.  Follow-up 1 year.  Most of the visit was spent in supporting the patient's husband.  We did discuss end-of-life care.  Patient has paperwork for power of attorney/healthcare power of attorney.     Cc:  Mast, Man X, NP

## 2019-01-07 ENCOUNTER — Other Ambulatory Visit: Payer: Self-pay

## 2019-01-07 ENCOUNTER — Telehealth (INDEPENDENT_AMBULATORY_CARE_PROVIDER_SITE_OTHER): Payer: Medicare Other | Admitting: Neurology

## 2019-01-07 ENCOUNTER — Encounter: Payer: Self-pay | Admitting: Neurology

## 2019-01-07 VITALS — Ht 65.0 in | Wt 88.0 lb

## 2019-01-07 DIAGNOSIS — F0281 Dementia in other diseases classified elsewhere with behavioral disturbance: Secondary | ICD-10-CM | POA: Diagnosis not present

## 2019-01-07 DIAGNOSIS — G20 Parkinson's disease: Secondary | ICD-10-CM | POA: Diagnosis not present

## 2019-01-09 ENCOUNTER — Other Ambulatory Visit: Payer: Self-pay

## 2019-01-09 MED ORDER — CARBIDOPA-LEVODOPA ER 50-200 MG PO TBCR: 1.0000 | EXTENDED_RELEASE_TABLET | Freq: Every day | ORAL | 1 refills | Status: DC

## 2019-01-09 MED ORDER — CARBIDOPA-LEVODOPA 25-100 MG PO TABS: 1.0000 | ORAL_TABLET | Freq: Three times a day (TID) | ORAL | 3 refills | Status: DC

## 2019-01-11 ENCOUNTER — Other Ambulatory Visit: Payer: Self-pay

## 2019-01-11 ENCOUNTER — Telehealth: Payer: Self-pay | Admitting: Neurology

## 2019-01-11 MED ORDER — CARBIDOPA-LEVODOPA 25-100 MG PO TABS: 1.0000 | ORAL_TABLET | Freq: Three times a day (TID) | ORAL | 3 refills | Status: AC

## 2019-01-11 NOTE — Telephone Encounter (Signed)
Please let her know that were refilled on the 10/21

## 2019-01-11 NOTE — Telephone Encounter (Signed)
Left a message with the after hour service on 01-11-19 @ 12:55 pm  Caller states that his wife has a medication that she will be out of in 1 week [facility] sent a refill request for the Caraidopa levodopa 25-100mg  to our office and has not heard anything

## 2019-01-14 ENCOUNTER — Other Ambulatory Visit: Payer: Self-pay

## 2019-01-14 MED ORDER — CARBIDOPA-LEVODOPA ER 50-200 MG PO TBCR: 1.0000 | EXTENDED_RELEASE_TABLET | Freq: Every day | ORAL | 1 refills | Status: AC

## 2019-02-01 DIAGNOSIS — M79672 Pain in left foot: Secondary | ICD-10-CM | POA: Diagnosis not present

## 2019-02-01 DIAGNOSIS — L84 Corns and callosities: Secondary | ICD-10-CM | POA: Diagnosis not present

## 2019-02-01 DIAGNOSIS — M79671 Pain in right foot: Secondary | ICD-10-CM | POA: Diagnosis not present

## 2019-02-01 DIAGNOSIS — B351 Tinea unguium: Secondary | ICD-10-CM | POA: Diagnosis not present

## 2019-02-11 ENCOUNTER — Other Ambulatory Visit: Payer: Self-pay

## 2019-02-11 DIAGNOSIS — E039 Hypothyroidism, unspecified: Secondary | ICD-10-CM | POA: Diagnosis not present

## 2019-02-11 DIAGNOSIS — N183 Chronic kidney disease, stage 3 unspecified: Secondary | ICD-10-CM

## 2019-02-11 LAB — CBC WITH DIFFERENTIAL/PLATELET
Absolute Monocytes: 488 cells/uL (ref 200–950)
Basophils Absolute: 80 cells/uL (ref 0–200)
Basophils Relative: 1 %
Eosinophils Absolute: 112 cells/uL (ref 15–500)
Eosinophils Relative: 1.4 %
HCT: 42.8 % (ref 35.0–45.0)
Hemoglobin: 14 g/dL (ref 11.7–15.5)
Lymphs Abs: 2600 cells/uL (ref 850–3900)
MCH: 30.6 pg (ref 27.0–33.0)
MCHC: 32.7 g/dL (ref 32.0–36.0)
MCV: 93.7 fL (ref 80.0–100.0)
MPV: 11.5 fL (ref 7.5–12.5)
Monocytes Relative: 6.1 %
Neutro Abs: 4720 cells/uL (ref 1500–7800)
Neutrophils Relative %: 59 %
Platelets: 236 10*3/uL (ref 140–400)
RBC: 4.57 10*6/uL (ref 3.80–5.10)
RDW: 12.3 % (ref 11.0–15.0)
Total Lymphocyte: 32.5 %
WBC: 8 10*3/uL (ref 3.8–10.8)

## 2019-02-11 LAB — COMPLETE METABOLIC PANEL WITH GFR
AG Ratio: 1.3 (calc) (ref 1.0–2.5)
ALT: 3 U/L — ABNORMAL LOW (ref 6–29)
AST: 16 U/L (ref 10–35)
Albumin: 3.9 g/dL (ref 3.6–5.1)
Alkaline phosphatase (APISO): 59 U/L (ref 37–153)
BUN/Creatinine Ratio: 31 (calc) — ABNORMAL HIGH (ref 6–22)
BUN: 31 mg/dL — ABNORMAL HIGH (ref 7–25)
CO2: 27 mmol/L (ref 20–32)
Calcium: 9.5 mg/dL (ref 8.6–10.4)
Chloride: 107 mmol/L (ref 98–110)
Creat: 1 mg/dL — ABNORMAL HIGH (ref 0.60–0.88)
GFR, Est African American: 60 mL/min/{1.73_m2} (ref >=60)
GFR, Est Non African American: 52 mL/min/{1.73_m2} — ABNORMAL LOW (ref >=60)
Globulin: 2.9 g/dL (calc) (ref 1.9–3.7)
Glucose, Bld: 90 mg/dL (ref 65–99)
Potassium: 5.2 mmol/L (ref 3.5–5.3)
Sodium: 143 mmol/L (ref 135–146)
Total Bilirubin: 0.4 mg/dL (ref 0.2–1.2)
Total Protein: 6.8 g/dL (ref 6.1–8.1)

## 2019-02-11 LAB — TSH: TSH: 0.96 mIU/L (ref 0.40–4.50)

## 2019-02-20 ENCOUNTER — Encounter: Payer: Self-pay | Admitting: Internal Medicine

## 2019-02-20 ENCOUNTER — Non-Acute Institutional Stay: Payer: Medicare Other | Admitting: Internal Medicine

## 2019-02-20 ENCOUNTER — Other Ambulatory Visit: Payer: Self-pay

## 2019-02-20 VITALS — BP 112/72 | HR 75 | Temp 97.3°F | Ht 65.0 in | Wt 85.2 lb

## 2019-02-20 DIAGNOSIS — N390 Urinary tract infection, site not specified: Secondary | ICD-10-CM | POA: Diagnosis not present

## 2019-02-20 DIAGNOSIS — R634 Abnormal weight loss: Secondary | ICD-10-CM | POA: Diagnosis not present

## 2019-02-20 DIAGNOSIS — N1831 Chronic kidney disease, stage 3a: Secondary | ICD-10-CM | POA: Diagnosis not present

## 2019-02-20 DIAGNOSIS — G20 Parkinson's disease: Secondary | ICD-10-CM | POA: Diagnosis not present

## 2019-02-20 DIAGNOSIS — R1312 Dysphagia, oropharyngeal phase: Secondary | ICD-10-CM

## 2019-02-20 DIAGNOSIS — F0281 Dementia in other diseases classified elsewhere with behavioral disturbance: Secondary | ICD-10-CM

## 2019-02-20 NOTE — Progress Notes (Signed)
Location:      Place of Service:     Provider:   Code Status:  Goals of Care:  Advanced Directives 01/07/2019  Does Patient Have a Medical Advance Directive? Yes  Type of Advance Directive Living will;Healthcare Power of Attorney  Does patient want to make changes to medical advance directive? -  Copy of [place] in Chart? -  Would patient like information on creating a medical advance directive? -  Pre-existing out of facility DNR order (yellow form or pink MOST form) -     Chief Complaint  Patient presents with  . Medical Management of Chronic Issues    3 month follow up with labs    HPI: Patient is a 85 y.o. Henry seen today for medical management of chronic diseases.   Patient has h/o Parkinson Disease, With Dementia, Recurrent UTI, Hypothyroidism And Hiatal Hernia  History of Parkinson disease Patient comes with her husband.  She is wheelchair-bound.  Is completely dependent on him for ADLs. Follows some simple commands. She though can feed herself. They live in [place]. She does go to [facility] clinic in [place] 2 /week Weight Loss Continues to have issues with eating.  Has since been actually doing suppertime she pockets the food and then does not eat properly. Has lost few pounds since her last visit Hiatal hernia Was diagnosed when she had a severe chest pain.  He uses OTC Prilosec as needed History of recurrent UTI Has been doing well on trimethoprim Past Medical History:  Diagnosis Date  . COPD (chronic obstructive pulmonary disease) ([place])   . Dysarthria   . Falls   . Gastroenteritis   . Hypothyroid   . Incontinence   . Parkinson disease ([place])    with dementia and hallucinations  . Rectal prolapse   . Recurrent UTI     Past Surgical History:  Procedure Laterality Date  . CATARACT EXTRACTION, BILATERAL    . CHOLECYSTECTOMY, LAPAROSCOPIC  2015   Dr. Dayton Martes   . FEMUR FRACTURE SURGERY    . REPAIR OF RECTAL PROLAPSE    .  TONSILLECTOMY AND ADENOIDECTOMY      No Known Allergies  Outpatient Encounter Medications as of 02/20/2019  Medication Sig  . carbidopa-levodopa (SINEMET CR) 50-200 MG tablet Take 1 tablet by mouth at bedtime.  . carbidopa-levodopa (SINEMET IR) 25-100 MG tablet Take 1 tablet by mouth 3 (three) times daily.  Marland Kitchen ENSURE (ENSURE) Take 237 mLs by mouth. One daily with lunch  . levothyroxine (SYNTHROID) 75 MCG tablet Take 1 tablet (75 mcg total) by mouth daily.  . QUEtiapine (SEROQUEL) 25 MG tablet Take 1 tablet (25 mg total) by mouth at bedtime.  Marland Kitchen trimethoprim (TRIMPEX) 100 MG tablet Take 100 mg by mouth daily.   No facility-administered encounter medications on file as of 02/20/2019.     Review of Systems:  Review of Systems  Unable to perform ROS: Dementia    Health Maintenance  Topic Date Due  . DEXA SCAN  01/30/2000  . PNA vac Low Risk Adult (2 of 2 - PPSV23) 01/23/2019  . TETANUS/TDAP  10/24/2028  . INFLUENZA VACCINE  Completed    Physical Exam: Vitals:   02/20/19 1457  BP: 112/72  Pulse: 75  Temp: (!) 97.3 F (36.3 C)  TempSrc: Temporal  SpO2: 91%  Weight: 85 lb 3.2 oz (38.6 kg)  Height: 5\' 5"  (1.651 m)   Body mass index is 14.18 kg/m. Physical Exam  Constitutional:Alert but Frail.  HENT:  Head: Normocephalic.  Mouth/Throat: Oropharynx is clear and moist.  Eyes: Pupils are equal, round, and reactive to light.  Neck: Neck supple.  Cardiovascular: Normal rate and normal heart sounds.  No murmur heard. Pulmonary/Chest: Effort normal and breath sounds normal. No respiratory distress. No wheezes. She has no rales.  Abdominal: Soft. Bowel sounds are normal. No distension. There is no tenderness. There is no rebound.  Musculoskeletal: No edema.  Lymphadenopathy: none Neurological:  Has Rigidity in all extremities Not following Commands Did have resting tremor today Skin: Skin is warm and dry.  Psychiatric: Normal mood and affect. Behavior is normal. Thought content  normal.    Labs reviewed: Basic Metabolic Panel: Recent Labs    03/26/18 0000 09/06/18 0000 09/06/18 1342 09/17/18 0803 02/11/19 1452  NA 141 141 143 144 143  K 4.9 4.5 5.4* 4.6 5.2  CL 105 106 110 108 107  CO2 28 25 23 31 27   GLUCOSE 89 77 91 87 Lisa  BUN 33* 32* 30* 39* 31*  CREATININE 0.96* 1.07* 1.06* 0.99* 1.00*  CALCIUM 9.5 9.4 9.5 9.7 9.5  TSH 1.61 1.51  --   --  0.96   Liver Function Tests: Recent Labs    03/26/18 0000 09/06/18 0000 02/11/19 1452  AST 23 19 16   ALT 4* 7 3*  BILITOT 0.5 0.4 0.4  PROT 7.0 7.1 6.8   No results for input(s): LIPASE, AMYLASE in the last 8760 hours. No results for input(s): AMMONIA in the last 8760 hours. CBC: Recent Labs    09/06/18 0000 09/06/18 1342 02/11/19 1452  WBC 6.7 7.9 8.0  NEUTROABS 3,866  --  4,720  HGB 14.5 14.1 14.0  HCT 43.1 43.8 42.8  MCV 92.5 94.8 93.7  PLT 233 205 236   Lipid Panel: Recent Labs    09/06/18 0000  CHOL 169  HDL 46*  LDLCALC 104*  TRIG 94  CHOLHDL 3.7   Lab Results  Component Value Date   HGBA1C 6.2 12/22/2015    Procedures since last visit: No results found.  Assessment/Plan Weight Loss Patient continues to have decreased appetite. Her husband is trying to keep up with her calorie intake. We discussed again that this is part of her end-stage dementia and Parkinson I will make a referral to speech.  As patient has been having some problems with dysphagia and pocketing food.  IParkinson disease On Sinemet Not able to tolerate Mostly wheelchair-bound and dependent for her ADLs Her husband still wants her to be taken care of in their apartment Was seen recently by Dr. 09/08/18 no changes were made  Dementia Continues on Seroquel nightly  CKD (chronic kidney disease) stage 3, GFR 30-59 ml/min Creatinine stable Hypothyroid TSH was normal Recurrent UTI Stable on trimethoprim VIT D Def She is not taking Vit D anymore Repeat Level is Normal   S/P Tetanus Shot   Labs/tests ordered:  * No order type specified * Next appt:  Visit date not found   Total time spent in this patient care encounter was  40_  minutes; greater than 50% of the visit spent counseling patient and staff, reviewing records , Labs and coordinating care for problems addressed at this encounter.

## 2019-02-20 NOTE — Addendum Note (Signed)
Addended by: Georgina Snell on: 02/20/2019 03:51 PM   Modules accepted: Orders

## 2019-04-02 ENCOUNTER — Telehealth: Payer: Self-pay | Admitting: Internal Medicine

## 2019-04-22 NOTE — Telephone Encounter (Signed)
RN Zella Ball) with [facility] called with husband & daughter present. Both concerned that Brier has had a significant decline in the last few days.  0 intake of food & severe diaherra  Robin(RN) said that she spoke with you regarding FL2 form but failed to mention the details.  Please advise on where you would like to add her to your schedule for tomorrow 04/03/19?  Thanks, Rosezella [place]

## 2019-04-22 NOTE — Telephone Encounter (Signed)
Patient is going to be referred to Hospice and does not need to see me tomorrow. [facility] nurse will let me know if she needs anything else

## 2019-04-22 DEATH — deceased

## 2019-08-15 ENCOUNTER — Other Ambulatory Visit: Payer: Self-pay

## 2019-08-21 ENCOUNTER — Encounter: Payer: Self-pay | Admitting: Internal Medicine

## 2020-01-09 ENCOUNTER — Ambulatory Visit: Payer: Medicare Other | Admitting: Neurology

## 2020-08-22 IMAGING — CT CT ANGIOGRAPHY CHEST
2 of 7 series · 19 of 46 positions shown · IV contrast (omnipaque)
Comparison: None.

CLINICAL DATA: Chest pain.  Dementia

EXAM:
CT ANGIOGRAPHY CHEST WITH CONTRAST
TECHNIQUE: Multidetector CT imaging of the chest was performed using the
standard protocol during bolus administration of intravenous
contrast. Multiplanar CT image reconstructions and MIPs were
obtained to evaluate the vascular anatomy.
CONTRAST:  75mL OMNIPAQUE IOHEXOL 350 MG/ML SOLN

[Series 9: thins · axial · 0.61mm/px · z∈[+1114,+1318]mm · 16 of 329 slices shown]
[im 19/329  lung]
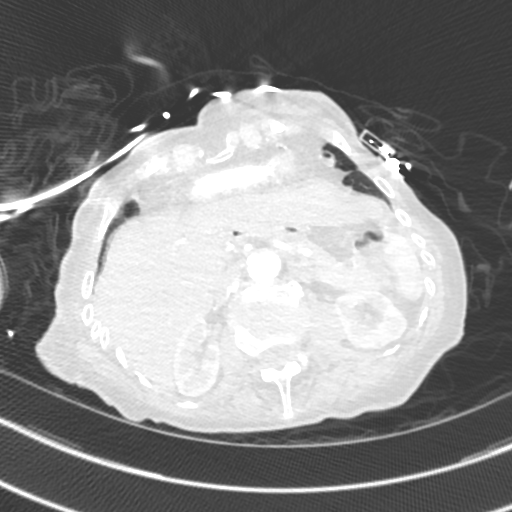
[im 37/329  soft-tissue]
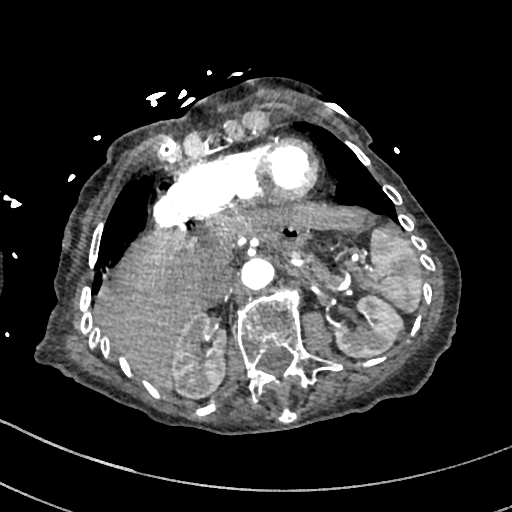
[im 55/329  lung]
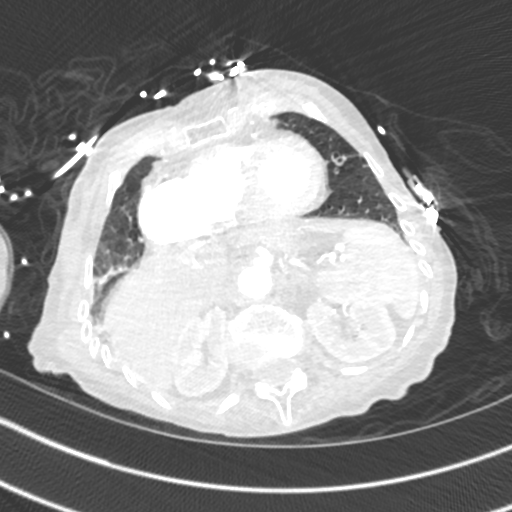
[im 73/329  soft-tissue]
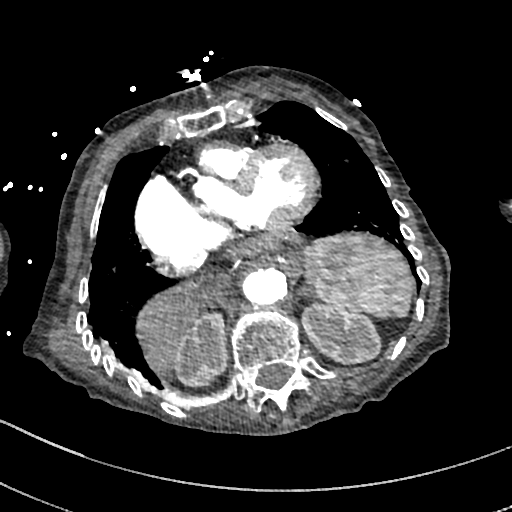
[im 92/329  lung]
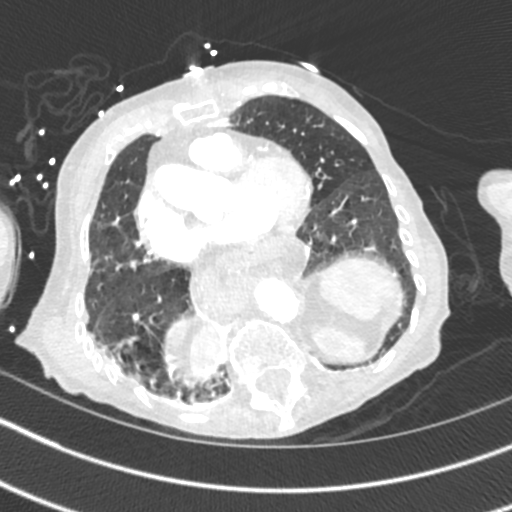
[im 110/329  soft-tissue]
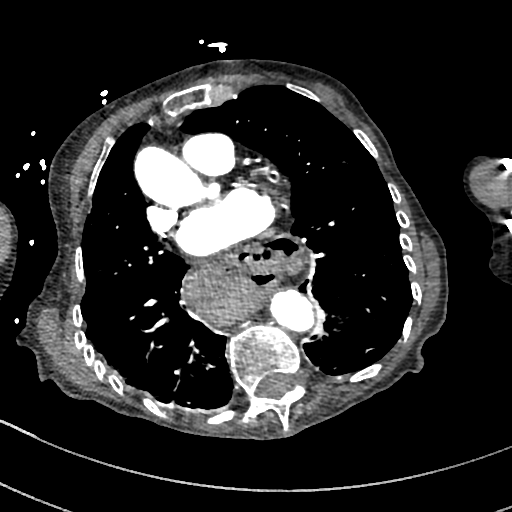
[im 128/329  lung]
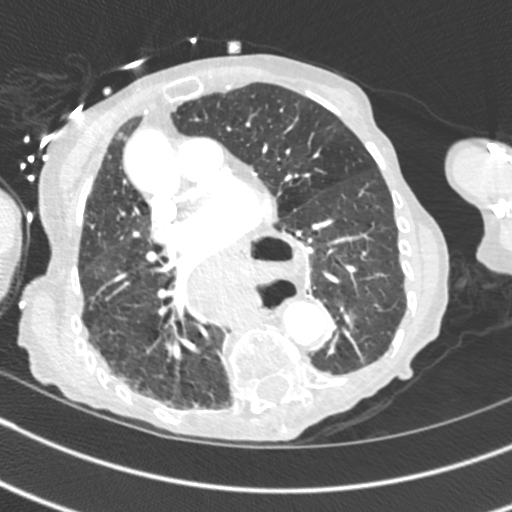
[im 146/329  soft-tissue]
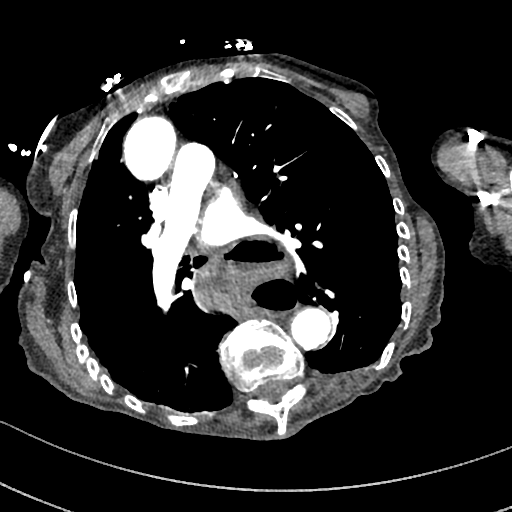
[im 183/329  lung]
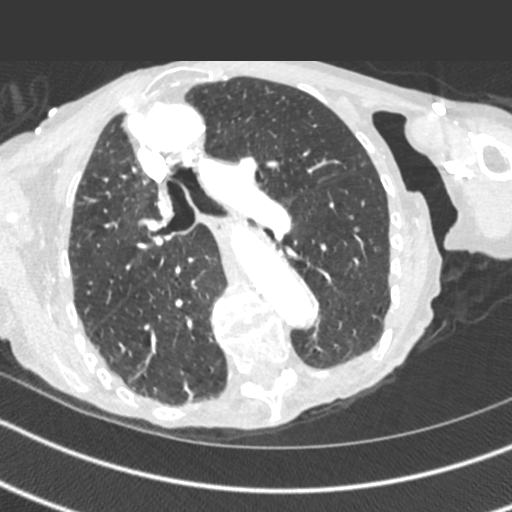
[im 201/329  soft-tissue]
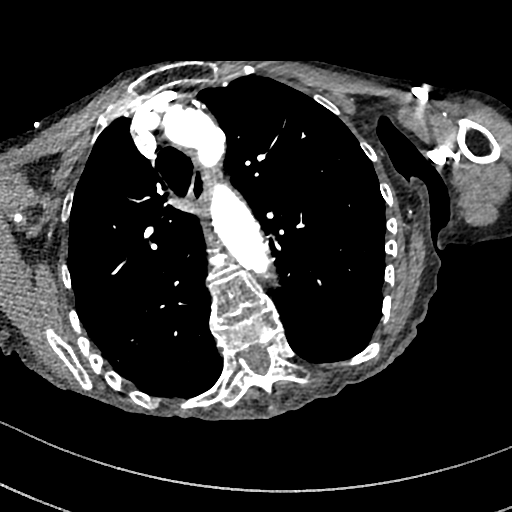
[im 219/329  lung]
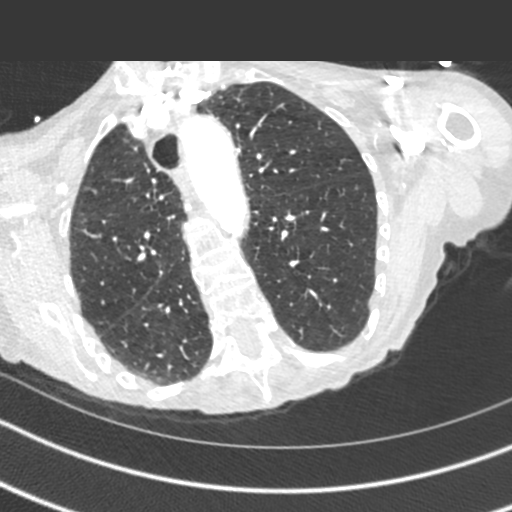
[im 237/329  soft-tissue]
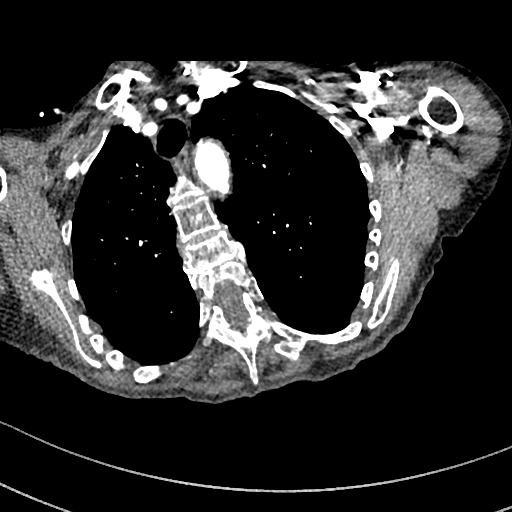
[im 256/329  lung]
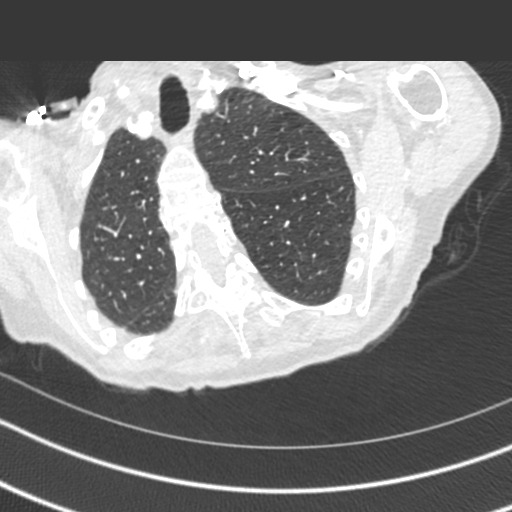
[im 274/329  soft-tissue]
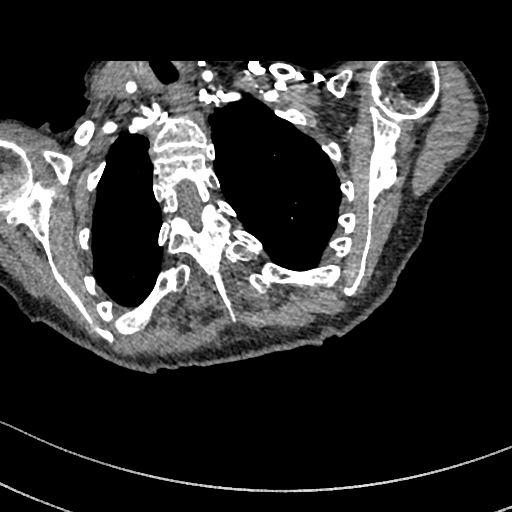
[im 292/329  lung]
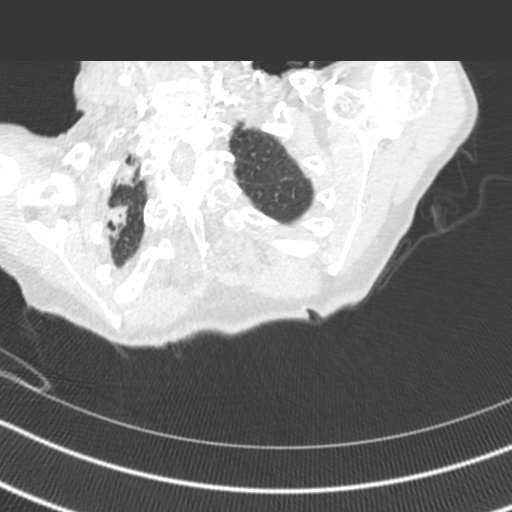
[im 310/329  soft-tissue]
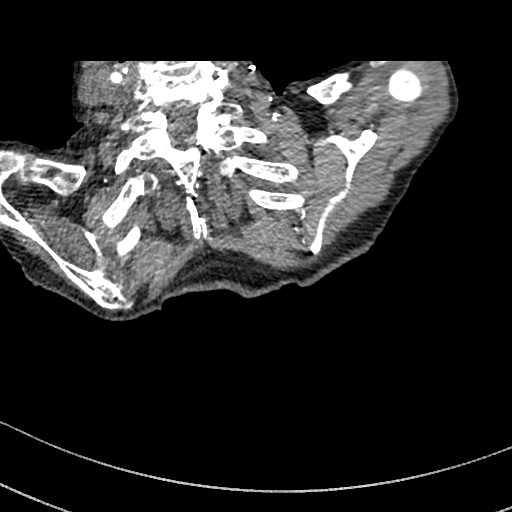

[Series 10: cor · coronal · 0.55mm/px · 3 of 130 slices shown]
[im 33/130  soft-tissue]
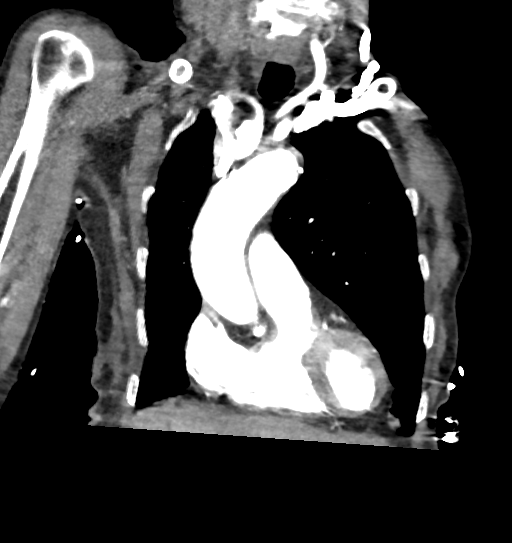
[im 65/130  soft-tissue]
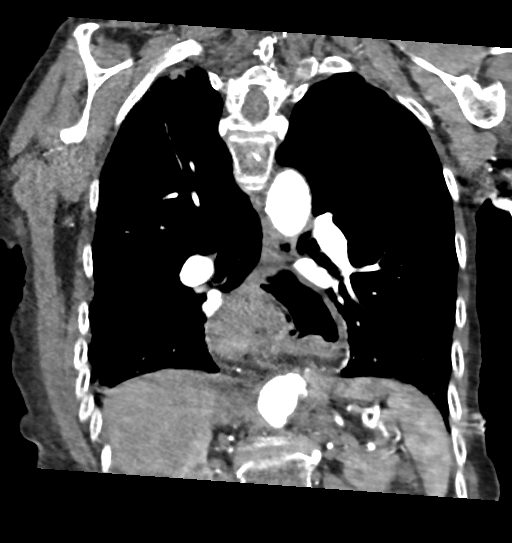
[im 97/130  soft-tissue]
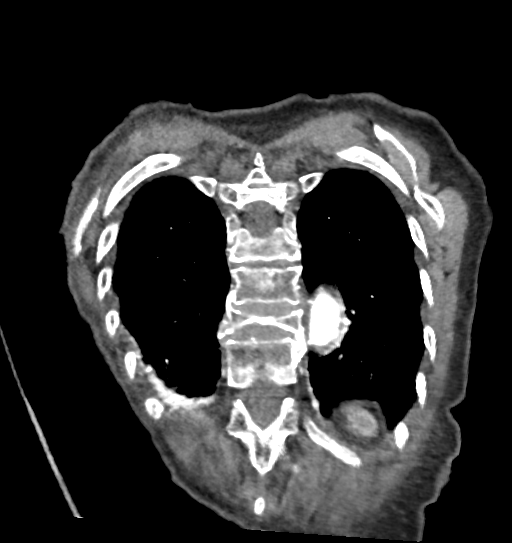

[19 of 46 positions shown; findings below may reference images not displayed]

FINDINGS: Cardiovascular: Heart size normal. No pericardial effusion.
Satisfactory opacification of pulmonary arteries noted, and there is
no evidence of pulmonary emboli. Scattered coronary calcifications.
Adequate contrast opacification of the thoracic aorta with no
evidence of dissection, aneurysm, or stenosis. There is classic
3-vessel brachiocephalic arch anatomy without proximal stenosis.
Calcified plaque in the arch and descending thoracic segment.
Visualized portions of abdominal aorta unremarkable.

Mediastinum/Nodes: Large hiatal hernia. No hilar or mediastinal
adenopathy.

Lungs/Pleura: No pleural effusion. No pneumothorax. No focal
airspace disease.

Upper Abdomen: No acute findings.

Musculoskeletal: 12 rib-bearing thoracic segments. Compression
deformities of T5, T6, T8, and T10, age indeterminate. Marked
thoracic kyphosis. Manubrial fracture of indeterminate acuity.

Review of the MIP images confirms the above findings.
IMPRESSION: 1. Negative for acute PE or thoracic aortic dissection.
2. Large hiatal hernia.
3. Sternal fracture, uncertain acuity
4. Multiple thoracic compression fracture deformities, age
indeterminate
5. Coronary and Aortic Atherosclerosis (2LPAR-170.0).
